# Patient Record
Sex: Male | Born: 1960 | Race: White | Hispanic: No | Marital: Married | Smoking: Current every day smoker
Health system: Southern US, Community
[De-identification: ages and names within clinical notes are randomized; demographics above are authoritative.]

## PROBLEM LIST (undated history)

## (undated) DIAGNOSIS — I10 Essential (primary) hypertension: Secondary | ICD-10-CM

## (undated) HISTORY — PX: APPENDECTOMY: SHX54

## (undated) HISTORY — DX: Essential (primary) hypertension: I10

---

## 1998-06-23 ENCOUNTER — Encounter: Payer: Self-pay | Admitting: General Surgery

## 1998-06-23 ENCOUNTER — Inpatient Hospital Stay (HOSPITAL_COMMUNITY): Admission: EM | Admit: 1998-06-23 | Discharge: 1998-07-01 | Payer: Self-pay | Admitting: Emergency Medicine

## 2019-10-14 ENCOUNTER — Other Ambulatory Visit: Payer: Self-pay

## 2019-10-14 ENCOUNTER — Encounter: Payer: Self-pay | Admitting: Physician Assistant

## 2019-10-14 ENCOUNTER — Ambulatory Visit (INDEPENDENT_AMBULATORY_CARE_PROVIDER_SITE_OTHER): Payer: Self-pay | Admitting: Physician Assistant

## 2019-10-14 DIAGNOSIS — Z8249 Family history of ischemic heart disease and other diseases of the circulatory system: Secondary | ICD-10-CM

## 2019-10-14 DIAGNOSIS — Z720 Tobacco use: Secondary | ICD-10-CM

## 2019-10-14 DIAGNOSIS — I10 Essential (primary) hypertension: Secondary | ICD-10-CM | POA: Insufficient documentation

## 2019-10-14 DIAGNOSIS — N529 Male erectile dysfunction, unspecified: Secondary | ICD-10-CM | POA: Insufficient documentation

## 2019-10-14 LAB — BASIC METABOLIC PANEL
BUN: 6 mg/dL (ref 6–23)
CO2: 28 mEq/L (ref 19–32)
Calcium: 9.8 mg/dL (ref 8.4–10.5)
Chloride: 98 mEq/L (ref 96–112)
Creatinine, Ser: 0.9 mg/dL (ref 0.40–1.50)
GFR: 86.35 mL/min (ref >60.00)
Glucose, Bld: 100 mg/dL — ABNORMAL HIGH (ref 70–99)
Potassium: 4 mEq/L (ref 3.5–5.1)
Sodium: 136 mEq/L (ref 135–145)

## 2019-10-14 MED ORDER — AMLODIPINE BESYLATE 10 MG PO TABS: 10.0000 mg | ORAL_TABLET | Freq: Every day | ORAL | 1 refills | Status: DC

## 2019-10-14 MED ORDER — SILDENAFIL CITRATE 20 MG PO TABS: ORAL_TABLET | ORAL | 0 refills | Status: DC

## 2019-10-14 MED ORDER — LISINOPRIL 10 MG PO TABS: 10.0000 mg | ORAL_TABLET | Freq: Every day | ORAL | 1 refills | Status: DC

## 2019-10-14 NOTE — Patient Instructions (Addendum)
Please go to the lab today for blood work.  I will call you with your results. We will alter treatment regimen(s) if indicated by your results.    I have sent medication refills to the pharmacy. I have also sent in a few tablets of generic viagra to use as directed for ED.  Keep well-hydrated and follow a low-salt diet.   We will follow-up every 6 months for your elevated BP. It is very important that we work on preventive measures -- colon cancer screening, lab work, Social research officer, government. Also stress testing giving your family history. I know you are self pay right now.  Please speak to the ladies at the front desk about information regarding the [facility] discount program so we can see if you qualify. This way we can hopefully get more things done for you in a very cost effective way for you. You may also want to look into our [employer] and [employer].    DASH Eating Plan DASH stands for "Dietary Approaches to Stop Hypertension." The DASH eating plan is a healthy eating plan that has been shown to reduce high blood pressure (hypertension). It may also reduce your risk for type 2 diabetes, heart disease, and stroke. The DASH eating plan may also help with weight loss. What are tips for following this plan?  General guidelines  Avoid eating more than 2,300 mg (milligrams) of salt (sodium) a day. If you have hypertension, you may need to reduce your sodium intake to 1,500 mg a day.  Limit alcohol intake to no more than 1 drink a day for nonpregnant women and 2 drinks a day for men. One drink equals 12 oz of beer, 5 oz of wine, or 1 oz of hard liquor.  Work with your health care provider to maintain a healthy body weight or to lose weight. Ask what an ideal weight is for you.  Get at least 30 minutes of exercise that causes your heart to beat faster (aerobic exercise) most days of the week. Activities may include walking, swimming, or biking.  Work with your health care provider or diet and  nutrition specialist (dietitian) to adjust your eating plan to your individual calorie needs. Reading food labels   Check food labels for the amount of sodium per serving. Choose foods with less than 5 percent of the Daily Value of sodium. Generally, foods with less than 300 mg of sodium per serving fit into this eating plan.  To find whole grains, look for the word "whole" as the first word in the ingredient list. Shopping  Buy products labeled as "low-sodium" or "no salt added."  Buy fresh foods. Avoid canned foods and premade or frozen meals. Cooking  Avoid adding salt when cooking. Use salt-free seasonings or herbs instead of table salt or sea salt. Check with your health care provider or pharmacist before using salt substitutes.  Do not fry foods. Cook foods using healthy methods such as baking, boiling, grilling, and broiling instead.  Cook with heart-healthy oils, such as olive, canola, soybean, or sunflower oil. Meal planning  Eat a balanced diet that includes: ? 5 or more servings of fruits and vegetables each day. At each meal, try to fill half of your plate with fruits and vegetables. ? Up to 6-8 servings of whole grains each day. ? Less than 6 oz of lean meat, poultry, or fish each day. A 3-oz serving of meat is about the same size as a deck of cards. One egg equals 1  oz. ? 2 servings of low-fat dairy each day. ? A serving of nuts, seeds, or beans 5 times each week. ? Heart-healthy fats. Healthy fats called Omega-3 fatty acids are found in foods such as flaxseeds and coldwater fish, like sardines, salmon, and mackerel.  Limit how much you eat of the following: ? Canned or prepackaged foods. ? Food that is high in trans fat, such as fried foods. ? Food that is high in saturated fat, such as fatty meat. ? Sweets, desserts, sugary drinks, and other foods with added sugar. ? Full-fat dairy products.  Do not salt foods before eating.  Try to eat at least 2 vegetarian  meals each week.  Eat more home-cooked food and less restaurant, buffet, and fast food.  When eating at a restaurant, ask that your food be prepared with less salt or no salt, if possible. What foods are recommended? The items listed may not be a complete list. Talk with your dietitian about what dietary choices are best for you. Grains Whole-grain or whole-wheat bread. Whole-grain or whole-wheat pasta. Brown rice. [place]. Bulgur. Whole-grain and low-sodium cereals. Pita bread. Low-fat, low-sodium crackers. Whole-wheat flour tortillas. Vegetables Fresh or frozen vegetables (raw, steamed, roasted, or grilled). Low-sodium or reduced-sodium tomato and vegetable juice. Low-sodium or reduced-sodium tomato sauce and tomato paste. Low-sodium or reduced-sodium canned vegetables. Fruits All fresh, dried, or frozen fruit. Canned fruit in natural juice (without added sugar). Meat and other protein foods Skinless chicken or [place]. Ground chicken or [place]. Pork with fat trimmed off. Fish and seafood. Egg whites. Dried beans, peas, or lentils. Unsalted nuts, nut butters, and seeds. Unsalted canned beans. Lean cuts of beef with fat trimmed off. Low-sodium, lean deli meat. Dairy Low-fat (1%) or fat-free (skim) milk. Fat-free, low-fat, or reduced-fat cheeses. Nonfat, low-sodium ricotta or cottage cheese. Low-fat or nonfat yogurt. Low-fat, low-sodium cheese. Fats and oils Soft margarine without trans fats. Vegetable oil. Low-fat, reduced-fat, or light mayonnaise and salad dressings (reduced-sodium). Canola, safflower, olive, soybean, and sunflower oils. Avocado. Seasoning and other foods Herbs. Spices. Seasoning mixes without salt. Unsalted popcorn and pretzels. Fat-free sweets. What foods are not recommended? The items listed may not be a complete list. Talk with your dietitian about what dietary choices are best for you. Grains Baked goods made with fat, such as croissants, muffins, or some  breads. Dry pasta or rice meal packs. Vegetables Creamed or fried vegetables. Vegetables in a cheese sauce. Regular canned vegetables (not low-sodium or reduced-sodium). Regular canned tomato sauce and paste (not low-sodium or reduced-sodium). Regular tomato and vegetable juice (not low-sodium or reduced-sodium). Angie Fava. Olives. Fruits Canned fruit in a light or heavy syrup. Fried fruit. Fruit in cream or butter sauce. Meat and other protein foods Fatty cuts of meat. Ribs. Fried meat. Berniece Salines. Sausage. Bologna and other processed lunch meats. Salami. Fatback. Hotdogs. Bratwurst. Salted nuts and seeds. Canned beans with added salt. Canned or smoked fish. Whole eggs or egg yolks. Chicken or [place] with skin. Dairy Whole or 2% milk, cream, and half-and-half. Whole or full-fat cream cheese. Whole-fat or sweetened yogurt. Full-fat cheese. Nondairy creamers. Whipped toppings. Processed cheese and cheese spreads. Fats and oils Butter. Stick margarine. Lard. Shortening. Ghee. Bacon fat. Tropical oils, such as coconut, palm kernel, or palm oil. Seasoning and other foods Salted popcorn and pretzels. Onion salt, garlic salt, seasoned salt, table salt, and sea salt. Worcestershire sauce. Tartar sauce. Barbecue sauce. Teriyaki sauce. Soy sauce, including reduced-sodium. Steak sauce. Canned and packaged gravies. Fish sauce. Oyster sauce.  Cocktail sauce. Horseradish that you find on the shelf. Ketchup. Mustard. Meat flavorings and tenderizers. Bouillon cubes. Hot sauce and Tabasco sauce. Premade or packaged marinades. Premade or packaged taco seasonings. Relishes. Regular salad dressings. Where to find more information:  National Heart, Lung, and Blood Institute: PopSteam.is  American Heart Association: www.heart.org Summary  The DASH eating plan is a healthy eating plan that has been shown to reduce high blood pressure (hypertension). It may also reduce your risk for type 2 diabetes, heart disease, and  stroke.  With the DASH eating plan, you should limit salt (sodium) intake to 2,300 mg a day. If you have hypertension, you may need to reduce your sodium intake to 1,500 mg a day.  When on the DASH eating plan, aim to eat more fresh fruits and vegetables, whole grains, lean proteins, low-fat dairy, and heart-healthy fats.  Work with your health care provider or diet and nutrition specialist (dietitian) to adjust your eating plan to your individual calorie needs. This information is not intended to replace advice given to you by your health care provider. Make sure you discuss any questions you have with your health care provider. Document Revised: 03/22/2017 Document Reviewed: 04/02/2016 Elsevier Patient Education  2020 [employer].

## 2019-10-14 NOTE — Progress Notes (Signed)
Patient presents to clinic today to establish care.  Patient with a past medical history of hypertension, diagnosed in 2019. Denies history of elevated cholesterol. Denies history of stroke or heart attack. Denies prior stress testing. Is a current smoker, smoking 4 ppd x 45 years. No interest in quitting. Is currently on a regimen of amlodipine and lisinopril 10 mg daily each. Endorses taking as directed and tolerating well. Notes BP has been well-controlled on this regimen. Patient denies chest pain, palpitations, lightheadedness, dizziness, vision changes or frequent headaches. Is in need of medication refills. Does not remember last time he had CPE or lab work done as he has been without insurance.   Patient also notes several year history of difficulty maintaining erection sufficient enough for penetration. Would like to discuss medication.   Health Maintenance: Colonoscopy -- Never had. Average risk. Asymptomatic. Is without insurance.   Past Medical History:  Diagnosis Date  . Hypertension     Past Surgical History:  Procedure Laterality Date  . APPENDECTOMY     30s    No current outpatient medications on file prior to visit.   No current facility-administered medications on file prior to visit.    No Known Allergies  Family History  Problem Relation Age of Onset  . Arthritis Mother   . Hearing loss Mother   . Heart disease Mother   . Hyperlipidemia Mother   . Hypertension Mother   . Kidney disease Mother   . Miscarriages / [place] Mother   . Stroke Mother   . Heart attack Father   . Heart disease Father   . Hypertension Father   . Stroke Father   . Asthma Sister   . Early death Sister   . Heart disease Sister   . Hypertension Sister   . Alcohol abuse Brother   . Asthma Brother   . Depression Brother   . Drug abuse Brother   . Heart attack Brother   . Hypertension Brother   . Mental illness Brother   . Stroke Brother   . Early death Son   . Cancer  Sister        Breast Cancer  . Heart attack Sister   . Heart disease Sister   . Hypertension Sister     Social History   Socioeconomic History  . Marital status: Married    Spouse name: Not on file  . Number of children: Not on file  . Years of education: Not on file  . Highest education level: Not on file  Occupational History  . Not on file  Tobacco Use  . Smoking status: Current Every Day Smoker    Types: Cigarettes, Cigars  . Smokeless tobacco: Never Used  Vaping Use  . Vaping Use: Every day  Substance and Sexual Activity  . Alcohol use: Yes    Alcohol/week: 15.0 standard drinks    Types: 15 Shots of liquor per week  . Drug use: Never  . Sexual activity: Yes    Birth control/protection: None  Other Topics Concern  . Not on file  Social History Narrative  . Not on file   Social Determinants of Health   Financial Resource Strain:   . Difficulty of Paying Living Expenses:   Food Insecurity:   . Worried About Charity fundraiser in the Last Year:   . Arboriculturist in the Last Year:   Transportation Needs:   . Film/video editor (Medical):   Marland Kitchen Lack of Transportation (Non-Medical):  Physical Activity:   . Days of Exercise per Week:   . Minutes of Exercise per Session:   Stress:   . Feeling of Stress :   Social Connections:   . Frequency of Communication with Friends and Family:   . Frequency of Social Gatherings with Friends and Family:   . Attends Religious Services:   . Active Member of Clubs or Organizations:   . Attends Banker Meetings:   Marland Kitchen Marital Status:   Intimate Partner Violence:   . Fear of Current or Ex-Partner:   . Emotionally Abused:   Marland Kitchen Physically Abused:   . Sexually Abused:    ROS Pertinent ROS are listed in the HPI.  BP 130/72   Pulse 81   Temp 97.9 F (36.6 C) (Temporal)   Resp 18   Ht 5' 10.5" (1.791 m)   Wt 143 lb 3.2 oz (65 kg)   SpO2 99%   BMI 20.26 kg/m   Physical Exam Vitals reviewed.    Constitutional:      Appearance: Normal appearance.  HENT:     Head: Normocephalic and atraumatic.  Eyes:     Extraocular Movements: Extraocular movements intact.     Conjunctiva/sclera: Conjunctivae normal.     Pupils: Pupils are equal, round, and reactive to light.  Cardiovascular:     Rate and Rhythm: Normal rate and regular rhythm.     Heart sounds: Normal heart sounds.  Pulmonary:     Effort: Pulmonary effort is normal.     Breath sounds: Normal breath sounds.  Musculoskeletal:     Cervical back: Neck supple.  Neurological:     General: No focal deficit present.     Mental Status: He is alert and oriented to person, place, and time.  Psychiatric:        Mood and Affect: Mood normal.    Assessment/Plan: 1. Essential hypertension BP stable today. Asymptomatic. Will check BMP to assess renal function and potassium with being on the lisinopril. Medications refilled. Discussed the need for preventive care as his cholesterol and glucose especially need to be checked giving hypertension, tobacco use and family history of early CAD. EKG and stress testing should also be considered. Patient without insurance and declines further intervention today. He has been encouraged to fill out application for the [facility] discount program so that screenings can be cost effective. HE is also to look into new managed medicaid - amLODipine (NORVASC) 10 MG tablet; Take 1 tablet (10 mg total) by mouth daily.  Dispense: 90 tablet; Refill: 1 - lisinopril (ZESTRIL) 10 MG tablet; Take 1 tablet (10 mg total) by mouth daily.  Dispense: 90 tablet; Refill: 1 - Basic metabolic panel  2. Erectile dysfunction, unspecified erectile dysfunction type Likely combination of age, hypertension and chronic tobacco use. Likely elevated cholesterol as well but unable to assess for this thus far. Will start trial of generic sildenafil 20 mg tablets to help with symptoms.   3. Family history of early CAD Significant family  history. Again recommend he apply for [facility] discount or managed medicaid so we can obtain preventive screenings for prevention. Recommend 81 mg ASA daily for him.   4. Tobacco abuse disorder 90 pack-year smoking history. Declines cessation AMA. Unable to afford lung cancer screens so will have to defer for now. Lungs CTAB today. No wheezing noted with patient ambulating. O2 sats look good. Will monitor closely.   This visit occurred during the SARS-CoV-2 public health emergency.  Safety protocols were in place,  including screening questions prior to the visit, additional usage of staff PPE, and extensive cleaning of exam room while observing appropriate contact time as indicated for disinfecting solutions.     Leeanne Rio, PA-C

## 2019-11-09 ENCOUNTER — Other Ambulatory Visit: Payer: Self-pay | Admitting: Physician Assistant

## 2020-10-17 ENCOUNTER — Other Ambulatory Visit: Payer: Self-pay

## 2020-10-17 DIAGNOSIS — I10 Essential (primary) hypertension: Secondary | ICD-10-CM

## 2020-10-17 MED ORDER — AMLODIPINE BESYLATE 10 MG PO TABS: 10.0000 mg | ORAL_TABLET | Freq: Every day | ORAL | 0 refills | Status: DC

## 2020-10-17 MED ORDER — LISINOPRIL 10 MG PO TABS: 10.0000 mg | ORAL_TABLET | Freq: Every day | ORAL | 0 refills | Status: DC

## 2021-03-24 ENCOUNTER — Ambulatory Visit (INDEPENDENT_AMBULATORY_CARE_PROVIDER_SITE_OTHER): Payer: Self-pay | Admitting: Family Medicine

## 2021-03-24 ENCOUNTER — Encounter: Payer: Self-pay | Admitting: Family Medicine

## 2021-03-24 VITALS — BP 128/80 | HR 101 | Temp 98.2°F | Resp 16 | Ht 70.5 in | Wt 156.6 lb

## 2021-03-24 DIAGNOSIS — Z13220 Encounter for screening for lipoid disorders: Secondary | ICD-10-CM

## 2021-03-24 DIAGNOSIS — Z720 Tobacco use: Secondary | ICD-10-CM

## 2021-03-24 DIAGNOSIS — Z8249 Family history of ischemic heart disease and other diseases of the circulatory system: Secondary | ICD-10-CM

## 2021-03-24 DIAGNOSIS — I10 Essential (primary) hypertension: Secondary | ICD-10-CM

## 2021-03-24 LAB — COMPREHENSIVE METABOLIC PANEL
ALT: 216 U/L — ABNORMAL HIGH (ref 0–53)
AST: 205 U/L — ABNORMAL HIGH (ref 0–37)
Albumin: 4.3 g/dL (ref 3.5–5.2)
Alkaline Phosphatase: 94 U/L (ref 39–117)
BUN: 9 mg/dL (ref 6–23)
CO2: 26 mEq/L (ref 19–32)
Calcium: 9.6 mg/dL (ref 8.4–10.5)
Chloride: 102 mEq/L (ref 96–112)
Creatinine, Ser: 0.98 mg/dL (ref 0.40–1.50)
GFR: 83.81 mL/min (ref >60.00)
Glucose, Bld: 61 mg/dL — ABNORMAL LOW (ref 70–99)
Potassium: 3.8 mEq/L (ref 3.5–5.1)
Sodium: 138 mEq/L (ref 135–145)
Total Bilirubin: 0.5 mg/dL (ref 0.2–1.2)
Total Protein: 8.9 g/dL — ABNORMAL HIGH (ref 6.0–8.3)

## 2021-03-24 LAB — LIPID PANEL
Cholesterol: 207 mg/dL — ABNORMAL HIGH (ref 0–200)
HDL: 59.1 mg/dL (ref >39.00)
LDL Cholesterol: 110 mg/dL — ABNORMAL HIGH (ref 0–99)
NonHDL: 147.85
Total CHOL/HDL Ratio: 4
Triglycerides: 190 mg/dL — ABNORMAL HIGH (ref 0.0–149.0)
VLDL: 38 mg/dL (ref 0.0–40.0)

## 2021-03-24 MED ORDER — LISINOPRIL 10 MG PO TABS: 10.0000 mg | ORAL_TABLET | Freq: Every day | ORAL | 1 refills | Status: DC

## 2021-03-24 MED ORDER — AMLODIPINE BESYLATE 5 MG PO TABS: 5.0000 mg | ORAL_TABLET | Freq: Every day | ORAL | 2 refills | Status: DC

## 2021-03-24 NOTE — Patient Instructions (Addendum)
Keep up the good work with cutting back on smoking. Quitting can be one of the best investments in your health. Let me know if you need assistance.   I do recommend meeting with cardiology about your chest pain with exertion. Let me know if I can place that referral. I would consider a baby aspirin  once per day. Return to the clinic or go to the nearest emergency room if any of your symptoms worsen or new symptoms occur.   If cholesterol is elevated I would definitely recommend a statin medicine based on your family history.   We will try lower dose blood pressure med to see if that lessens dizziness. Keep a record of your blood pressures outside of the office and if running low on meds, or over 140/90 - let me know.   Thanks for coming in today.   Managing Your Hypertension Hypertension, also called high blood pressure, is when the force of the blood pressing against the walls of the arteries is too strong. Arteries are blood vessels that carry blood from your heart throughout your body. Hypertension forces the heart to work harder to pump blood and may cause the arteries to become narrow or stiff. Understanding blood pressure readings Your personal target blood pressure may vary depending on your medical conditions, your age, and other factors. A blood pressure reading includes a higher number over a lower number. Ideally, your blood pressure should be below 120/80. You should know that: The first, or top, number is called the systolic pressure. It is a measure of the pressure in your arteries as your heart beats. The second, or bottom number, is called the diastolic pressure. It is a measure of the pressure in your arteries as the heart relaxes. Blood pressure is classified into four stages. Based on your blood pressure reading, your health care provider may use the following stages to determine what type of treatment you need, if any. Systolic pressure and diastolic pressure are measured in  a unit called mmHg. Normal Systolic pressure: below 120. Diastolic pressure: below 80. Elevated Systolic pressure: 120-129. Diastolic pressure: below 80. Hypertension stage 1 Systolic pressure: 130-139. Diastolic pressure: 80-89. Hypertension stage 2 Systolic pressure: 140 or above. Diastolic pressure: 90 or above. How can this condition affect me? Managing your hypertension is an important responsibility. Over time, hypertension can damage the arteries and decrease blood flow to important parts of the body, including the brain, heart, and kidneys. Having untreated or uncontrolled hypertension can lead to: A heart attack. A stroke. A weakened blood vessel (aneurysm). Heart failure. Kidney damage. Eye damage. Metabolic syndrome. Memory and concentration problems. Vascular dementia. What actions can I take to manage this condition? Hypertension can be managed by making lifestyle changes and possibly by taking medicines. Your health care provider will help you make a plan to bring your blood pressure within a normal range. Nutrition  Eat a diet that is high in fiber and potassium, and low in salt (sodium), added sugar, and fat. An example eating plan is called the Dietary Approaches to Stop Hypertension (DASH) diet. To eat this way: Eat plenty of fresh fruits and vegetables. Try to fill one-half of your plate at each meal with fruits and vegetables. Eat whole grains, such as whole-wheat pasta, brown rice, or whole-grain bread. Fill about one-fourth of your plate with whole grains. Eat low-fat dairy products. Avoid fatty cuts of meat, processed or cured meats, and poultry with skin. Fill about one-fourth of your plate with lean  proteins such as fish, chicken without skin, beans, eggs, and tofu. Avoid pre-made and processed foods. These tend to be higher in sodium, added sugar, and fat. Reduce your daily sodium intake. Most people with hypertension should eat less than 1,500 mg of sodium  a day. Lifestyle  Work with your health care provider to maintain a healthy body weight or to lose weight. Ask what an ideal weight is for you. Get at least 30 minutes of exercise that causes your heart to beat faster (aerobic exercise) most days of the week. Activities may include walking, swimming, or biking. Include exercise to strengthen your muscles (resistance exercise), such as weight lifting, as part of your weekly exercise routine. Try to do these types of exercises for 30 minutes at least 3 days a week. Do not use any products that contain nicotine or tobacco, such as cigarettes, e-cigarettes, and chewing tobacco. If you need help quitting, ask your health care provider. Control any long-term (chronic) conditions you have, such as high cholesterol or diabetes. Identify your sources of stress and find ways to manage stress. This may include meditation, deep breathing, or making time for fun activities. Alcohol use Do not drink alcohol if: Your health care provider tells you not to drink. You are pregnant, may be pregnant, or are planning to become pregnant. If you drink alcohol: Limit how much you use to: 0-1 drink a day for women. 0-2 drinks a day for men. Be aware of how much alcohol is in your drink. In the [place] one drink equals one 12 oz bottle of beer (355 mL), one 5 oz glass of wine (148 mL), or one 1 oz glass of hard liquor (44 mL). Medicines Your health care provider may prescribe medicine if lifestyle changes are not enough to get your blood pressure under control and if: Your systolic blood pressure is 130 or higher. Your diastolic blood pressure is 80 or higher. Take medicines only as told by your health care provider. Follow the directions carefully. Blood pressure medicines must be taken as told by your health care provider. The medicine does not work as well when you skip doses. Skipping doses also puts you at risk for problems. Monitoring Before you monitor your  blood pressure: Do not smoke, drink caffeinated beverages, or exercise within 30 minutes before taking a measurement. Use the bathroom and empty your bladder (urinate). Sit quietly for at least 5 minutes before taking measurements. Monitor your blood pressure at home as told by your health care provider. To do this: Sit with your back straight and supported. Place your feet flat on the floor. Do not cross your legs. Support your arm on a flat surface, such as a table. Make sure your upper arm is at heart level. Each time you measure, take two or three readings one minute apart and record the results. You may also need to have your blood pressure checked regularly by your health care provider. General information Talk with your health care provider about your diet, exercise habits, and other lifestyle factors that may be contributing to hypertension. Review all the medicines you take with your health care provider because there may be side effects or interactions. Keep all visits as told by your health care provider. Your health care provider can help you create and adjust your plan for managing your high blood pressure. Where to find more information National Heart, Lung, and Blood Institute: [employer] American Heart Association: www.heart.org Contact a health care provider if: You think you  are having a reaction to medicines you have taken. You have repeated (recurrent) headaches. You feel dizzy. You have swelling in your ankles. You have trouble with your vision. Get help right away if: You develop a severe headache or confusion. You have unusual weakness or numbness, or you feel faint. You have severe pain in your chest or abdomen. You vomit repeatedly. You have trouble breathing. These symptoms may represent a serious problem that is an emergency. Do not wait to see if the symptoms will go away. Get medical help right away. Call your local emergency services (911 in the  [place] Do not drive yourself to the hospital. Summary Hypertension is when the force of blood pumping through your arteries is too strong. If this condition is not controlled, it may put you at risk for serious complications. Your personal target blood pressure may vary depending on your medical conditions, your age, and other factors. For most people, a normal blood pressure is less than 120/80. Hypertension is managed by lifestyle changes, medicines, or both. Lifestyle changes to help manage hypertension include losing weight, eating a healthy, low-sodium diet, exercising more, stopping smoking, and limiting alcohol. This information is not intended to replace advice given to you by your health care provider. Make sure you discuss any questions you have with your health care provider. Document Revised: 04/27/2019 Document Reviewed: 03/10/2019 Elsevier Patient Education  2022 [employer]    Steps to Quit Smoking Smoking tobacco is the leading cause of preventable death. It can affect almost every organ in the body. Smoking puts you and those around you at risk for developing many serious chronic diseases. Quitting smoking can be difficult, but it is one of the best things that you can do for your health. It is never too late to quit. How do I get ready to quit? When you decide to quit smoking, create a plan to help you succeed. Before you quit: Pick a date to quit. Set a date within the next 2 weeks to give you time to prepare. Write down the reasons why you are quitting. Keep this list in places where you will see it often. Tell your family, friends, and co-workers that you are quitting. Support from your loved ones can make quitting easier. Talk with your health care provider about your options for quitting smoking. Find out what treatment options are covered by your health insurance. Identify people, places, things, and activities that make you want to smoke (triggers). Avoid them. What  first steps can I take to quit smoking? Throw away all cigarettes at home, at work, and in your car. Throw away smoking accessories, such as Set designer. Clean your car. Make sure to empty the ashtray. Clean your home, including curtains and carpets. What strategies can I use to quit smoking? Talk with your health care provider about combining strategies, such as taking medicines while you are also receiving in-person counseling. Using these two strategies together makes you more likely to succeed in quitting than if you used either strategy on its own. If you are pregnant or breastfeeding, talk with your health care provider about finding counseling or other support strategies to quit smoking. Do not take medicine to help you quit smoking unless your health care provider tells you to do so. To quit smoking: Quit right away Quit smoking completely, instead of gradually reducing how much you smoke over a period of time. Research shows that stopping smoking right away is more successful than gradually quitting. Attend  in-person counseling to help you build problem-solving skills. You are more likely to succeed in quitting if you attend counseling sessions regularly. Even short sessions of 10 minutes can be effective. Take medicine You may take medicines to help you quit smoking. Some medicines require a prescription and some you can purchase over-the-counter. Medicines may have nicotine in them to replace the nicotine in cigarettes. Medicines may: Help to stop cravings. Help to relieve withdrawal symptoms. Your health care provider may recommend: Nicotine patches, gum, or lozenges. Nicotine inhalers or sprays. Non-nicotine medicine that is taken by mouth. Find resources Find resources and support systems that can help you to quit smoking and remain smoke-free after you quit. These resources are most helpful when you use them often. They include: Online chats with a Veterinary surgeon. Telephone  quitlines. Printed Materials engineer. Support groups or group counseling. Text messaging programs. Mobile phone apps or applications. Use apps that can help you stick to your quit plan by providing reminders, tips, and encouragement. There are many free apps for mobile devices as well as websites. Examples include Quit Guide from the [employer] and smokefree.gov What things can I do to make it easier to quit?  Reach out to your family and friends for support and encouragement. Call telephone quitlines (1-800-QUIT-NOW), reach out to support groups, or work with a counselor for support. Ask people who smoke to avoid smoking around you. Avoid places that trigger you to smoke, such as bars, parties, or smoke-break areas at work. Spend time with people who do not smoke. Lessen the stress in your life. Stress can be a smoking trigger for some people. To lessen stress, try: Exercising regularly. Doing deep-breathing exercises. Doing yoga. Meditating. Performing a body scan. This involves closing your eyes, scanning your body from head to toe, and noticing which parts of your body are particularly tense. Try to relax the muscles in those areas. How will I feel when I quit smoking? Day 1 to 3 weeks Within the first 24 hours of quitting smoking, you may start to feel withdrawal symptoms. These symptoms are usually most noticeable 2-3 days after quitting, but they usually do not last for more than 2-3 weeks. You may experience these symptoms: Mood swings. Restlessness, anxiety, or irritability. Trouble concentrating. Dizziness. Strong cravings for sugary foods and nicotine. Mild weight gain. Constipation. Nausea. Coughing or a sore throat. Changes in how the medicines that you take for unrelated issues work in your body. Depression. Trouble sleeping (insomnia). Week 3 and afterward After the first 2-3 weeks of quitting, you may start to notice more positive results, such as: Improved sense of smell  and taste. Decreased coughing and sore throat. Slower heart rate. Lower blood pressure. Clearer skin. The ability to breathe more easily. Fewer sick days. Quitting smoking can be very challenging. Do not get discouraged if you are not successful the first time. Some people need to make many attempts to quit before they achieve long-term success. Do your best to stick to your quit plan, and talk with your health care provider if you have any questions or concerns. Summary Smoking tobacco is the leading cause of preventable death. Quitting smoking is one of the best things that you can do for your health. When you decide to quit smoking, create a plan to help you succeed. Quit smoking right away, not slowly over a period of time. When you start quitting, seek help from your health care provider, family, or friends. This information is not intended to replace advice  given to you by your health care provider. Make sure you discuss any questions you have with your health care provider. Document Revised: 12/16/2020 Document Reviewed: 06/28/2018 Elsevier Patient Education  2022 [employer].

## 2021-03-24 NOTE — Progress Notes (Signed)
Subjective:  Patient ID: Evan Bryant, male    DOB: 12/17/1960  Age: 60 y.o. MRN: [id number]  CC:  Chief Complaint  Patient presents with   Establish Care    Pt here to establish care, pt reports he is in need of a refill on his blood pressure medications today no concerns as they have worked well for him     HPI Evan Bryant presents for  New patient to establish care.  Previous primary care provider Malva Cogan, PA-C.  Hypertension: Treated with lisinopril 10 mg daily, amlodipine 10 mg daily.  Last labs normal in June 2021.  Glucose borderline at 100.off meds for 2 days, no side effects on meds.  Home readings: 118 systolic, 70-75.  Gets dizzy at times working in yard, or bending down/standing up.  FH CAD - sister deceased with heart disease, other sister with ICD/pacemeker, brother with MI and CVA, father had CVA and CABGx4. No personal hx of MI/CVA. Declines referral to cardiology at this time.  Chest pain with exertion - using tiller in garden, raking leaves, same for 10 years, no recent changes. Rests and has cigarette and feels better. No dyspnea.  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   BP Readings from Last 3 Encounters:  03/24/21 128/80  10/14/19 130/72   Lab Results  Component Value Date   CREATININE 0.90 10/14/2019   Nicotine addiction Cigarettes, 4 packs/day x 45 years, now cut back to 1 pack per day. Easier with GF quitting. Plans to eventually quit smoking  Erectile dysfunction Discussed last year.  Difficulty maintaining erection.  Sildenafil 20 mg prescribed - thinks was ental issue - did not need. Doing ok now without meds.   Health maintenance: Flu vaccine: declines COVID-vaccine: declines.  Colonoscopy/colon cancer screening: deferred.  History Patient Active Problem List   Diagnosis Date Noted   Hypertension 10/14/2019   Erectile dysfunction 10/14/2019   Family history of early CAD 10/14/2019   Tobacco abuse disorder 10/14/2019    Past Medical History:  Diagnosis Date   Hypertension    Past Surgical History:  Procedure Laterality Date   APPENDECTOMY     30s   No Known Allergies Prior to Admission medications   Medication Sig Start Date End Date Taking? Authorizing Provider  amLODipine (NORVASC) 10 MG tablet Take 1 tablet (10 mg total) by mouth daily. 10/17/20  Yes Worthy Rancher B, FNP  lisinopril (ZESTRIL) 10 MG tablet Take 1 tablet (10 mg total) by mouth daily. 10/17/20  Yes Worthy Rancher B, FNP  sildenafil (REVATIO) 20 MG tablet TAKE 1-2 TABLET BY MOUTH AS DIRECTED FOR ED. NO MORE THAN 1 DOSE IN 24 HOUR PERIOD. Patient not taking: Reported on 03/24/2021 11/09/19   Waldon Merl, PA-C   Social History   Socioeconomic History   Marital status: Married    Spouse name: Not on file   Number of children: Not on file   Years of education: Not on file   Highest education level: Not on file  Occupational History   Not on file  Tobacco Use   Smoking status: Every Day    Types: Cigarettes, Cigars   Smokeless tobacco: Never  Vaping Use   Vaping Use: Every day  Substance and Sexual Activity   Alcohol use: Yes    Alcohol/week: 15.0 standard drinks    Types: 15 Shots of liquor per week   Drug use: Never   Sexual activity: Yes    Birth control/protection: None  Other Topics Concern   Not on file  Social History Narrative   Not on file   Social Determinants of Health   Financial Resource Strain: Not on file  Food Insecurity: Not on file  Transportation Needs: Not on file  Physical Activity: Not on file  Stress: Not on file  Social Connections: Not on file  Intimate Partner Violence: Not on file    Review of Systems  Per HPI.  Objective:   Vitals:   03/24/21 0803  BP: 128/80  Pulse: (!) 101  Resp: 16  Temp: 98.2 F (36.8 C)  TempSrc: Temporal  SpO2: 99%  Weight: 156 lb 9.6 oz (71 kg)  Height: 5' 10.5" (1.791 m)     Physical Exam Vitals reviewed.  Constitutional:       Appearance: He is well-developed.  HENT:     Head: Normocephalic and atraumatic.  Neck:     Vascular: No carotid bruit or JVD.  Cardiovascular:     Rate and Rhythm: Normal rate and regular rhythm.     Heart sounds: Normal heart sounds. No murmur heard. Pulmonary:     Effort: Pulmonary effort is normal.     Breath sounds: Normal breath sounds. No rales.  Musculoskeletal:     Right lower leg: No edema.     Left lower leg: No edema.  Skin:    General: Skin is warm and dry.  Neurological:     Mental Status: He is alert and oriented to person, place, and time.  Psychiatric:        Mood and Affect: Mood normal.       Assessment & Plan:  Evan Bryant is a 60 y.o. male . Essential hypertension - Plan: amLODipine (NORVASC) 5 MG tablet, lisinopril (ZESTRIL) 10 MG tablet, Comprehensive metabolic panel  -Controlled, including off past 2 days.  Question lower readings on current regimen with episodic lightheadedness/dizziness.  Decrease amlodipine to 5 mg with close home monitoring.  Call if elevated or low readings.  Family history of early CAD - Plan: Lipid panel  -Discussed concerns with multiple family members with heart disease.  Does report chest pain only with maximal exertion, not with usual activities.  No change in symptoms, reports stability past 10 years.  Importance of smoking cessation discussed as below.  Check lipid panel and would strongly recommend a statin if elevated.  Discussed cardiology eval but deferred at this time.  ER/RTC precautions.  Tobacco abuse disorder  -Commended on cutting back to 1 pack, and plans for continued work towards cessation.  Handout given.  Screening for hyperlipidemia - Plan: Comprehensive metabolic panel, Lipid panel   Meds ordered this encounter  Medications   amLODipine (NORVASC) 5 MG tablet    Sig: Take 1 tablet (5 mg total) by mouth daily.    Dispense:  90 tablet    Refill:  2    No more refills on this Rx. Provider no longer in  office. Patient must find new provider to continue refills.   lisinopril (ZESTRIL) 10 MG tablet    Sig: Take 1 tablet (10 mg total) by mouth daily.    Dispense:  90 tablet    Refill:  1    No more refills on this Rx. Provider no longer in office. Patient must find new provider to continue refills.   Patient Instructions  Keep up the good work with cutting back on smoking. Quitting can be one of the best investments in your health. Let me know if  you need assistance.   I do recommend meeting with cardiology about your chest pain with exertion. Let me know if I can place that referral. I would consider a baby aspirin 81mg  once per day. Return to the clinic or go to the nearest emergency room if any of your symptoms worsen or new symptoms occur.   If cholesterol is elevated I would definitely recommend a statin medicine based on your family history.   We will try lower dose blood pressure med to see if that lessens dizziness. Keep a record of your blood pressures outside of the office and if running low on meds, or over 140/90 - let me know.   Thanks for coming in today.   Managing Your Hypertension Hypertension, also called high blood pressure, is when the force of the blood pressing against the walls of the arteries is too strong. Arteries are blood vessels that carry blood from your heart throughout your body. Hypertension forces the heart to work harder to pump blood and may cause the arteries to become narrow or stiff. Understanding blood pressure readings Your personal target blood pressure may vary depending on your medical conditions, your age, and other factors. A blood pressure reading includes a higher number over a lower number. Ideally, your blood pressure should be below 120/80. You should know that: The first, or top, number is called the systolic pressure. It is a measure of the pressure in your arteries as your heart beats. The second, or bottom number, is called the diastolic  pressure. It is a measure of the pressure in your arteries as the heart relaxes. Blood pressure is classified into four stages. Based on your blood pressure reading, your health care provider may use the following stages to determine what type of treatment you need, if any. Systolic pressure and diastolic pressure are measured in a unit called mmHg. Normal Systolic pressure: below 120. Diastolic pressure: below 80. Elevated Systolic pressure: 120-129. Diastolic pressure: below 80. Hypertension stage 1 Systolic pressure: 130-139. Diastolic pressure: 80-89. Hypertension stage 2 Systolic pressure: 140 or above. Diastolic pressure: 90 or above. How can this condition affect me? Managing your hypertension is an important responsibility. Over time, hypertension can damage the arteries and decrease blood flow to important parts of the body, including the brain, heart, and kidneys. Having untreated or uncontrolled hypertension can lead to: A heart attack. A stroke. A weakened blood vessel (aneurysm). Heart failure. Kidney damage. Eye damage. Metabolic syndrome. Memory and concentration problems. Vascular dementia. What actions can I take to manage this condition? Hypertension can be managed by making lifestyle changes and possibly by taking medicines. Your health care provider will help you make a plan to bring your blood pressure within a normal range. Nutrition  Eat a diet that is high in fiber and potassium, and low in salt (sodium), added sugar, and fat. An example eating plan is called the Dietary Approaches to Stop Hypertension (DASH) diet. To eat this way: Eat plenty of fresh fruits and vegetables. Try to fill one-half of your plate at each meal with fruits and vegetables. Eat whole grains, such as whole-wheat pasta, brown rice, or whole-grain bread. Fill about one-fourth of your plate with whole grains. Eat low-fat dairy products. Avoid fatty cuts of meat, processed or cured meats,  and poultry with skin. Fill about one-fourth of your plate with lean proteins such as fish, chicken without skin, beans, eggs, and tofu. Avoid pre-made and processed foods. These tend to be higher in sodium, added  sugar, and fat. Reduce your daily sodium intake. Most people with hypertension should eat less than 1,500 mg of sodium a day. Lifestyle  Work with your health care provider to maintain a healthy body weight or to lose weight. Ask what an ideal weight is for you. Get at least 30 minutes of exercise that causes your heart to beat faster (aerobic exercise) most days of the week. Activities may include walking, swimming, or biking. Include exercise to strengthen your muscles (resistance exercise), such as weight lifting, as part of your weekly exercise routine. Try to do these types of exercises for 30 minutes at least 3 days a week. Do not use any products that contain nicotine or tobacco, such as cigarettes, e-cigarettes, and chewing tobacco. If you need help quitting, ask your health care provider. Control any long-term (chronic) conditions you have, such as high cholesterol or diabetes. Identify your sources of stress and find ways to manage stress. This may include meditation, deep breathing, or making time for fun activities. Alcohol use Do not drink alcohol if: Your health care provider tells you not to drink. You are pregnant, may be pregnant, or are planning to become pregnant. If you drink alcohol: Limit how much you use to: 0-1 drink a day for women. 0-2 drinks a day for men. Be aware of how much alcohol is in your drink. In the [place] one drink equals one 12 oz bottle of beer (355 mL), one 5 oz glass of wine (148 mL), or one 1 oz glass of hard liquor (44 mL). Medicines Your health care provider may prescribe medicine if lifestyle changes are not enough to get your blood pressure under control and if: Your systolic blood pressure is 130 or higher. Your diastolic blood pressure  is 80 or higher. Take medicines only as told by your health care provider. Follow the directions carefully. Blood pressure medicines must be taken as told by your health care provider. The medicine does not work as well when you skip doses. Skipping doses also puts you at risk for problems. Monitoring Before you monitor your blood pressure: Do not smoke, drink caffeinated beverages, or exercise within 30 minutes before taking a measurement. Use the bathroom and empty your bladder (urinate). Sit quietly for at least 5 minutes before taking measurements. Monitor your blood pressure at home as told by your health care provider. To do this: Sit with your back straight and supported. Place your feet flat on the floor. Do not cross your legs. Support your arm on a flat surface, such as a table. Make sure your upper arm is at heart level. Each time you measure, take two or three readings one minute apart and record the results. You may also need to have your blood pressure checked regularly by your health care provider. General information Talk with your health care provider about your diet, exercise habits, and other lifestyle factors that may be contributing to hypertension. Review all the medicines you take with your health care provider because there may be side effects or interactions. Keep all visits as told by your health care provider. Your health care provider can help you create and adjust your plan for managing your high blood pressure. Where to find more information National Heart, Lung, and Blood Institute: PopSteam.is American Heart Association: www.heart.org Contact a health care provider if: You think you are having a reaction to medicines you have taken. You have repeated (recurrent) headaches. You feel dizzy. You have swelling in your ankles. You have  trouble with your vision. Get help right away if: You develop a severe headache or confusion. You have unusual weakness or  numbness, or you feel faint. You have severe pain in your chest or abdomen. You vomit repeatedly. You have trouble breathing. These symptoms may represent a serious problem that is an emergency. Do not wait to see if the symptoms will go away. Get medical help right away. Call your local emergency services (911 in the [place] Do not drive yourself to the hospital. Summary Hypertension is when the force of blood pumping through your arteries is too strong. If this condition is not controlled, it may put you at risk for serious complications. Your personal target blood pressure may vary depending on your medical conditions, your age, and other factors. For most people, a normal blood pressure is less than 120/80. Hypertension is managed by lifestyle changes, medicines, or both. Lifestyle changes to help manage hypertension include losing weight, eating a healthy, low-sodium diet, exercising more, stopping smoking, and limiting alcohol. This information is not intended to replace advice given to you by your health care provider. Make sure you discuss any questions you have with your health care provider. Document Revised: 04/27/2019 Document Reviewed: 03/10/2019 Elsevier Patient Education  2022 Elsevier Inc.    Steps to Quit Smoking Smoking tobacco is the leading cause of preventable death. It can affect almost every organ in the body. Smoking puts you and those around you at risk for developing many serious chronic diseases. Quitting smoking can be difficult, but it is one of the best things that you can do for your health. It is never too late to quit. How do I get ready to quit? When you decide to quit smoking, create a plan to help you succeed. Before you quit: Pick a date to quit. Set a date within the next 2 weeks to give you time to prepare. Write down the reasons why you are quitting. Keep this list in places where you will see it often. Tell your family, friends, and co-workers that you  are quitting. Support from your loved ones can make quitting easier. Talk with your health care provider about your options for quitting smoking. Find out what treatment options are covered by your health insurance. Identify people, places, things, and activities that make you want to smoke (triggers). Avoid them. What first steps can I take to quit smoking? Throw away all cigarettes at home, at work, and in your car. Throw away smoking accessories, such as Set designer. Clean your car. Make sure to empty the ashtray. Clean your home, including curtains and carpets. What strategies can I use to quit smoking? Talk with your health care provider about combining strategies, such as taking medicines while you are also receiving in-person counseling. Using these two strategies together makes you more likely to succeed in quitting than if you used either strategy on its own. If you are pregnant or breastfeeding, talk with your health care provider about finding counseling or other support strategies to quit smoking. Do not take medicine to help you quit smoking unless your health care provider tells you to do so. To quit smoking: Quit right away Quit smoking completely, instead of gradually reducing how much you smoke over a period of time. Research shows that stopping smoking right away is more successful than gradually quitting. Attend in-person counseling to help you build problem-solving skills. You are more likely to succeed in quitting if you attend counseling sessions regularly. Even short sessions  of 10 minutes can be effective. Take medicine You may take medicines to help you quit smoking. Some medicines require a prescription and some you can purchase over-the-counter. Medicines may have nicotine in them to replace the nicotine in cigarettes. Medicines may: Help to stop cravings. Help to relieve withdrawal symptoms. Your health care provider may recommend: Nicotine patches, gum, or  lozenges. Nicotine inhalers or sprays. Non-nicotine medicine that is taken by mouth. Find resources Find resources and support systems that can help you to quit smoking and remain smoke-free after you quit. These resources are most helpful when you use them often. They include: Online chats with a Veterinary surgeon. Telephone quitlines. Printed Materials engineer. Support groups or group counseling. Text messaging programs. Mobile phone apps or applications. Use apps that can help you stick to your quit plan by providing reminders, tips, and encouragement. There are many free apps for mobile devices as well as websites. Examples include Quit Guide from the [employer] and smokefree.gov What things can I do to make it easier to quit?  Reach out to your family and friends for support and encouragement. Call telephone quitlines (1-800-QUIT-NOW), reach out to support groups, or work with a counselor for support. Ask people who smoke to avoid smoking around you. Avoid places that trigger you to smoke, such as bars, parties, or smoke-break areas at work. Spend time with people who do not smoke. Lessen the stress in your life. Stress can be a smoking trigger for some people. To lessen stress, try: Exercising regularly. Doing deep-breathing exercises. Doing yoga. Meditating. Performing a body scan. This involves closing your eyes, scanning your body from head to toe, and noticing which parts of your body are particularly tense. Try to relax the muscles in those areas. How will I feel when I quit smoking? Day 1 to 3 weeks Within the first 24 hours of quitting smoking, you may start to feel withdrawal symptoms. These symptoms are usually most noticeable 2-3 days after quitting, but they usually do not last for more than 2-3 weeks. You may experience these symptoms: Mood swings. Restlessness, anxiety, or irritability. Trouble concentrating. Dizziness. Strong cravings for sugary foods and nicotine. Mild weight  gain. Constipation. Nausea. Coughing or a sore throat. Changes in how the medicines that you take for unrelated issues work in your body. Depression. Trouble sleeping (insomnia). Week 3 and afterward After the first 2-3 weeks of quitting, you may start to notice more positive results, such as: Improved sense of smell and taste. Decreased coughing and sore throat. Slower heart rate. Lower blood pressure. Clearer skin. The ability to breathe more easily. Fewer sick days. Quitting smoking can be very challenging. Do not get discouraged if you are not successful the first time. Some people need to make many attempts to quit before they achieve long-term success. Do your best to stick to your quit plan, and talk with your health care provider if you have any questions or concerns. Summary Smoking tobacco is the leading cause of preventable death. Quitting smoking is one of the best things that you can do for your health. When you decide to quit smoking, create a plan to help you succeed. Quit smoking right away, not slowly over a period of time. When you start quitting, seek help from your health care provider, family, or friends. This information is not intended to replace advice given to you by your health care provider. Make sure you discuss any questions you have with your health care provider. Document Revised: 12/16/2020 Document  Reviewed: 06/28/2018 Elsevier Patient Education  2022 Elsevier Inc.      Signed,   Meredith Staggers, MD [facility], [facility] 03/24/21 8:51 AM

## 2021-04-11 ENCOUNTER — Other Ambulatory Visit: Payer: Self-pay | Admitting: Family Medicine

## 2021-04-11 DIAGNOSIS — R7989 Other specified abnormal findings of blood chemistry: Secondary | ICD-10-CM

## 2021-04-19 ENCOUNTER — Telehealth: Payer: Self-pay

## 2021-04-19 ENCOUNTER — Other Ambulatory Visit (INDEPENDENT_AMBULATORY_CARE_PROVIDER_SITE_OTHER): Payer: Self-pay

## 2021-04-19 DIAGNOSIS — R7989 Other specified abnormal findings of blood chemistry: Secondary | ICD-10-CM

## 2021-04-19 NOTE — Telephone Encounter (Signed)
-----   Message from Shade Flood, MD sent at 04/11/2021  9:41 AM EST ----- Call patient. Blood sugar borderline low at last visit, liver tests were elevated.  Unfortunately I do not have any previous levels to compare to.  Please have him return for a lab only visit to recheck those levels this week if possible.  If he drinks any alcohol should cut back.  If any abdominal pain, nausea, vomiting, or yellowing of the skin or eyes should be seen right away.  Let me know if there are questions.

## 2021-04-19 NOTE — Telephone Encounter (Signed)
Patient returned call, he is aware of labs and scheduled today to repeat levels

## 2021-04-19 NOTE — Telephone Encounter (Signed)
Lvm for patient to return call about lab results

## 2021-04-20 LAB — COMPREHENSIVE METABOLIC PANEL
ALT: 128 U/L — ABNORMAL HIGH (ref 0–53)
AST: 124 U/L — ABNORMAL HIGH (ref 0–37)
Albumin: 4 g/dL (ref 3.5–5.2)
Alkaline Phosphatase: 81 U/L (ref 39–117)
BUN: 13 mg/dL (ref 6–23)
CO2: 25 mEq/L (ref 19–32)
Calcium: 9.2 mg/dL (ref 8.4–10.5)
Chloride: 102 mEq/L (ref 96–112)
Creatinine, Ser: 1.11 mg/dL (ref 0.40–1.50)
GFR: 72.13 mL/min (ref >60.00)
Glucose, Bld: 96 mg/dL (ref 70–99)
Potassium: 3.9 mEq/L (ref 3.5–5.1)
Sodium: 136 mEq/L (ref 135–145)
Total Bilirubin: 0.5 mg/dL (ref 0.2–1.2)
Total Protein: 7.9 g/dL (ref 6.0–8.3)

## 2021-04-25 ENCOUNTER — Telehealth: Payer: Self-pay

## 2021-04-25 DIAGNOSIS — R7989 Other specified abnormal findings of blood chemistry: Secondary | ICD-10-CM

## 2021-04-25 NOTE — Telephone Encounter (Signed)
Noted- continue to cut back with repeat LFT's in next 3-4 weeks - lab visit fine.

## 2021-04-25 NOTE — Telephone Encounter (Signed)
-----   Message from Shade Flood, MD sent at 04/25/2021 12:03 PM EST ----- Call patient  Liver test still elevated but lower than readings last month. How much alcohol is he drinking and has he cut back since initial visit with elevated readings?

## 2021-06-12 ENCOUNTER — Telehealth: Payer: Self-pay

## 2021-06-12 NOTE — Telephone Encounter (Signed)
Liver test still elevated but lower than readings last month. How much alcohol is he drinking and has he cut back since initial visit with elevated readings?

## 2021-06-12 NOTE — Telephone Encounter (Signed)
-----   Message from Shade Flood, MD sent at 06/10/2021  3:28 PM EST ----- Please see previous note.

## 2021-06-13 NOTE — Telephone Encounter (Signed)
Noted. Has future lab visit ordered- recommend having this performed in next few weeks.

## 2021-06-13 NOTE — Telephone Encounter (Signed)
Spoke with patient he sounded hesitant and stated that he had "kind of" reduced his indicated this was not much decrease as the patient was unable to give me a reference (I.e one bottle of beer rather than 2) pt willing to come back for more repeat testing if necessary

## 2021-06-13 NOTE — Telephone Encounter (Signed)
Pt scheduled for 06/29/21 lab only

## 2021-06-29 ENCOUNTER — Other Ambulatory Visit (INDEPENDENT_AMBULATORY_CARE_PROVIDER_SITE_OTHER): Payer: Self-pay

## 2021-06-29 DIAGNOSIS — R7989 Other specified abnormal findings of blood chemistry: Secondary | ICD-10-CM

## 2021-06-29 LAB — HEPATIC FUNCTION PANEL
ALT: 136 U/L — ABNORMAL HIGH (ref 0–53)
AST: 110 U/L — ABNORMAL HIGH (ref 0–37)
Albumin: 4.2 g/dL (ref 3.5–5.2)
Alkaline Phosphatase: 93 U/L (ref 39–117)
Bilirubin, Direct: 0.2 mg/dL (ref 0.0–0.3)
Total Bilirubin: 0.7 mg/dL (ref 0.2–1.2)
Total Protein: 8.2 g/dL (ref 6.0–8.3)

## 2021-07-04 ENCOUNTER — Other Ambulatory Visit: Payer: Self-pay | Admitting: Family Medicine

## 2021-07-04 DIAGNOSIS — R7989 Other specified abnormal findings of blood chemistry: Secondary | ICD-10-CM

## 2021-07-04 NOTE — Progress Notes (Signed)
See lab notes, elevated LFTs. ?

## 2021-07-27 ENCOUNTER — Ambulatory Visit
Admission: RE | Admit: 2021-07-27 | Discharge: 2021-07-27 | Disposition: A | Discharge status: Home / Self Care | Payer: No Typology Code available for payment source | Source: Ambulatory Visit | Attending: Family Medicine | Admitting: Family Medicine

## 2021-07-27 DIAGNOSIS — R7989 Other specified abnormal findings of blood chemistry: Secondary | ICD-10-CM

## 2021-09-22 ENCOUNTER — Ambulatory Visit (INDEPENDENT_AMBULATORY_CARE_PROVIDER_SITE_OTHER): Payer: Self-pay | Admitting: Family Medicine

## 2021-09-22 VITALS — BP 138/76 | HR 73 | Temp 98.2°F | Resp 16 | Ht 70.5 in | Wt 149.8 lb

## 2021-09-22 DIAGNOSIS — I10 Essential (primary) hypertension: Secondary | ICD-10-CM

## 2021-09-22 DIAGNOSIS — Z789 Other specified health status: Secondary | ICD-10-CM

## 2021-09-22 DIAGNOSIS — R7989 Other specified abnormal findings of blood chemistry: Secondary | ICD-10-CM

## 2021-09-22 DIAGNOSIS — R42 Dizziness and giddiness: Secondary | ICD-10-CM

## 2021-09-22 LAB — COMPREHENSIVE METABOLIC PANEL
ALT: 131 U/L — ABNORMAL HIGH (ref 0–53)
AST: 116 U/L — ABNORMAL HIGH (ref 0–37)
Albumin: 4.4 g/dL (ref 3.5–5.2)
Alkaline Phosphatase: 106 U/L (ref 39–117)
BUN: 12 mg/dL (ref 6–23)
CO2: 26 mEq/L (ref 19–32)
Calcium: 10.2 mg/dL (ref 8.4–10.5)
Chloride: 101 mEq/L (ref 96–112)
Creatinine, Ser: 1.1 mg/dL (ref 0.40–1.50)
GFR: 72.7 mL/min (ref >60.00)
Glucose, Bld: 91 mg/dL (ref 70–99)
Potassium: 4.4 mEq/L (ref 3.5–5.1)
Sodium: 135 mEq/L (ref 135–145)
Total Bilirubin: 0.5 mg/dL (ref 0.2–1.2)
Total Protein: 9.4 g/dL — ABNORMAL HIGH (ref 6.0–8.3)

## 2021-09-22 MED ORDER — AMLODIPINE BESYLATE 5 MG PO TABS: 5.0000 mg | ORAL_TABLET | Freq: Every day | ORAL | 2 refills | Status: DC

## 2021-09-22 MED ORDER — LISINOPRIL 10 MG PO TABS: 10.0000 mg | ORAL_TABLET | Freq: Every day | ORAL | 1 refills | Status: DC

## 2021-09-22 NOTE — Patient Instructions (Addendum)
Start drinking more water or flavored water with initial goal of 48 ounces per day. Cut back on coffee some as well. If lightheadedness/dizziness does not improve, come see me.   Continue to try to cut back on alcohol, and I will refer you to specialist for liver test elevation.   Return to the clinic or go to the nearest emergency room if any of your symptoms worsen or new symptoms occur.

## 2021-09-22 NOTE — Progress Notes (Signed)
Subjective:  Patient ID: Evan Bryant, male    DOB: 1960-05-08  Age: 61 y.o. 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  New job, notes some dizziness with bending down and standing up quick at times. Home readings:120-130/85 on meds.  Off meds past 2 days.  No regular water during the day - coffee and coke only. Up to 10 cups coffee per day.  BP Readings from Last 3 Encounters:  09/22/21 138/76  03/24/21 128/80  10/14/19 130/72   Lab Results  Component Value Date   CREATININE 1.11 04/19/2021   LFT elevation AST 110, ALT 136 on 06/29/2021, similar to readings in December 2022 at 124, 128 respectively.  Abdominal ultrasound limited of the right upper quadrant performed July 27, 2021.  No no focal liver lesion identified, mild increased echogenicity of hepatic parenchyma suggesting hepatic steatosis.  Probable 3 mm gallbladder polyp. Alcohol: 1 pint per week - has cut back past 6 weeks. prior heavier use - up to gallon per week. No difficulty cutting back.  No abd pain. No jaundice/scleral icterus.    History Patient Active Problem List   Diagnosis Date Noted   Hypertension 10/14/2019   Erectile dysfunction 10/14/2019   Family history of early CAD 10/14/2019   Tobacco abuse disorder 10/14/2019   Past Medical History:  Diagnosis Date   Hypertension    Past Surgical History:  Procedure Laterality Date   APPENDECTOMY     30s   No Known Allergies Prior to Admission medications   Medication Sig Start Date End Date Taking? Authorizing Provider  amLODipine (NORVASC) 5 MG tablet Take 1 tablet (5 mg total) by mouth daily. 03/24/21  Yes Shade Flood, MD  lisinopril (ZESTRIL) 10 MG tablet Take 1 tablet (10 mg total) by mouth daily. 03/24/21  Yes Shade Flood, MD  sildenafil (REVATIO) 20 MG tablet TAKE 1-2 TABLET BY MOUTH AS DIRECTED FOR ED. NO MORE THAN 1 DOSE IN 24 HOUR PERIOD. 11/09/19  Yes Waldon Merl, PA-C   Social History   Socioeconomic History   Marital status: Married    Spouse name: Not on file   Number of children: Not on file   Years of education: Not on file   Highest education level: Not on file  Occupational History   Not on file  Tobacco Use   Smoking status: Every Day    Types: Cigarettes, Cigars   Smokeless tobacco: Never  Vaping Use   Vaping Use: Every day  Substance and Sexual Activity   Alcohol use: Yes    Alcohol/week: 15.0 standard drinks    Types: 15 Shots of liquor per week   Drug use: Never   Sexual activity: Yes    Birth control/protection: None  Other Topics Concern   Not on file  Social History Narrative   Not on file   Social Determinants of Health   Financial Resource Strain: Not on file  Food Insecurity: Not on file  Transportation Needs: Not on file  Physical Activity: Not on file  Stress: Not on file  Social Connections: Not on file  Intimate Partner Violence: Not on file    Review of Systems  Constitutional:  Negative for fatigue and unexpected weight change.  Eyes:  Negative for visual disturbance.  Respiratory:  Negative for cough, chest tightness and shortness of breath.   Cardiovascular:  Negative for chest pain, palpitations and leg swelling.  Gastrointestinal:  Negative for abdominal pain and blood in stool.  Neurological:  Positive for light-headedness (episodic as above.). Negative for dizziness and headaches.    Objective:   Vitals:   09/22/21 0850  BP: 138/76  Pulse: 73  Resp: 16  Temp: 98.2 F (36.8 C)  TempSrc: Oral  SpO2: 98%  Weight: 149 lb 12.8 oz (67.9 kg)  Height: 5' 10.5" (1.791 m)     Physical Exam Vitals reviewed.   Constitutional:      Appearance: He is well-developed.  HENT:     Head: Normocephalic and atraumatic.  Neck:     Vascular: No carotid bruit or JVD.  Cardiovascular:     Rate and Rhythm: Normal rate and regular rhythm.     Heart sounds: Normal heart sounds. No murmur heard. Pulmonary:     Effort: Pulmonary effort is normal.     Breath sounds: Normal breath sounds. No rales.  Musculoskeletal:     Right lower leg: No edema.     Left lower leg: No edema.  Skin:    General: Skin is warm and dry.  Neurological:     Mental Status: He is alert and oriented to person, place, and time.  Psychiatric:        Mood and Affect: Mood normal.       Assessment & Plan:  [name] is a 62 y.o. male . Elevated LFTs - Plan: Comprehensive metabolic panel, Hepatitis B surface antigen, Hepatitis C antibody, Hepatitis B surface antibody,quantitative, Ambulatory referral to Gastroenterology Alcohol use  -Possible component of alcohol liver disease.  Ultrasound noted.commended on decreased alcohol but continue to work on last intake.  Repeat LFTs.    Check hepatitis testing.  Refer to GI to decide on further testing/imaging.  Essential hypertension - Plan: Comprehensive metabolic panel, lisinopril (ZESTRIL) 10 MG tablet, amLODipine (NORVASC) 5 MG tablet  -Stable on meds.  Slight increased reading in office off medication past few days.  Episodic lightheadedness likely due to relative volume depletion with absence of water intake and large amount of caffeine intake.  Cut back on coffee intake, increased water intake discussed.  RTC precautions if lightheadedness recurs, continue same regimen on meds for now with 78-month follow-up  Episodic lightheadedness  -New concern, treatment as above.  Meds ordered this encounter  Medications   lisinopril (ZESTRIL) 10 MG tablet    Sig: Take 1 tablet (10 mg total) by mouth daily.    Dispense:  90 tablet    Refill:  1   amLODipine (NORVASC) 5 MG tablet     Sig: Take 1 tablet (5 mg total) by mouth daily.    Dispense:  90 tablet    Refill:  2   Patient Instructions  Start drinking more water or flavored water with initial goal of 48 ounces per day. Cut back on coffee some as well. If lightheadedness/dizziness does not improve, come see me.   Continue to try to cut back on alcohol, and I will refer you to specialist for liver test elevation.   Return to the clinic or go to the nearest emergency room if any of your symptoms worsen  or new symptoms occur.        Signed,   Meredith StaggersJeffrey Kiwana Deblasi, MD [facility], [facility] 09/22/21 9:30 AM

## 2021-09-28 LAB — HCV RNA,QUANTITATIVE REAL TIME PCR
HCV Quantitative Log: 5.53 Log IU/mL — ABNORMAL HIGH
HCV RNA, PCR, QN: 336000 IU/mL — ABNORMAL HIGH

## 2021-09-28 LAB — HEPATITIS C ANTIBODY
Hepatitis C Ab: REACTIVE — AB
SIGNAL TO CUT-OFF: >11 — ABNORMAL HIGH (ref <1.00)

## 2021-09-28 LAB — HEPATITIS B SURFACE ANTIBODY, QUANTITATIVE: Hep B S AB Quant (Post): <5 m[IU]/mL — ABNORMAL LOW (ref >=10)

## 2021-09-28 LAB — HEPATITIS B SURFACE ANTIGEN: Hepatitis B Surface Ag: NONREACTIVE

## 2021-10-06 ENCOUNTER — Other Ambulatory Visit: Payer: Self-pay | Admitting: Family Medicine

## 2021-10-06 DIAGNOSIS — B1920 Unspecified viral hepatitis C without hepatic coma: Secondary | ICD-10-CM

## 2021-10-06 NOTE — Progress Notes (Signed)
See labs 

## 2021-10-10 ENCOUNTER — Encounter: Payer: Self-pay | Admitting: Family

## 2021-10-10 ENCOUNTER — Other Ambulatory Visit (HOSPITAL_COMMUNITY): Payer: Self-pay

## 2021-10-10 ENCOUNTER — Ambulatory Visit (INDEPENDENT_AMBULATORY_CARE_PROVIDER_SITE_OTHER): Payer: Self-pay | Admitting: Family

## 2021-10-10 ENCOUNTER — Other Ambulatory Visit: Payer: Self-pay

## 2021-10-10 VITALS — BP 151/92 | HR 70 | Temp 97.7°F | Wt 142.0 lb

## 2021-10-10 DIAGNOSIS — I10 Essential (primary) hypertension: Secondary | ICD-10-CM

## 2021-10-10 DIAGNOSIS — B182 Chronic viral hepatitis C: Secondary | ICD-10-CM

## 2021-10-10 NOTE — Progress Notes (Signed)
Subjective:    Patient ID: Evan Bryant, male    DOB: [birth year], 61 y.o.   MRN: [id number]  Chief Complaint  Patient presents with   Hepatitis C    HPI:  Evan Bryant is a 61 y.o. male with previous medical history of hypertension and tobacco use presenting today for initial evaluation of Hepatitis C.   Evan Bryant was initially noted to have elevated LFTs following an office visit on 03/24/21 with Internal Medicine. AST was 205 and ALT 216. On follow up lab work completed on 04/19/21 AST was 124 and ALT 128. Recommendation was to cut back on alcohol at that time. LFTs continued to be elevated on 06/29/21 with AST 110 and ALT 136. [place] 07/27/21 with probable hepatic steatosis  with no lesions. Hepatitis testing with negative Hepatitis B antigen and not immune to Hepatitis B and positive Hepatitis C antibody with Hepatitis C RNA level of 336,000. This was the first he was diagnosed with Hepatitis C. Risk factors include previous IV drug use and age being born between 3 and 33. No previous treatment and without symptoms. Denies abdominal pain, nausea, vomiting, fatigue, scleral icterus, or jaundice. No personal history of liver disease. Drinks about 1/5 of alcohol per week with no recreational or illicit drug use with every day tobacco use.    No Known Allergies    Outpatient Medications Prior to Visit  Medication Sig Dispense Refill   amLODipine (NORVASC) 5 MG tablet Take 1 tablet (5 mg total) by mouth daily. 90 tablet 2   lisinopril (ZESTRIL) 10 MG tablet Take 1 tablet (10 mg total) by mouth daily. 90 tablet 1   sildenafil (REVATIO) 20 MG tablet TAKE 1-2 TABLET BY MOUTH AS DIRECTED FOR ED. NO MORE THAN 1 DOSE IN 24 HOUR PERIOD. 10 tablet 0   No facility-administered medications prior to visit.     Past Medical History:  Diagnosis Date   Hypertension       Past Surgical History:  Procedure Laterality Date   APPENDECTOMY     30s      Family History  Problem Relation Age  of Onset   Arthritis Mother    Hearing loss Mother    Heart disease Mother    Hyperlipidemia Mother    Hypertension Mother    Kidney disease Mother    Miscarriages / [place] Mother    Stroke Mother    Heart attack Father    Heart disease Father    Hypertension Father    Stroke Father    Asthma Sister    Early death Sister    Heart disease Sister    Hypertension Sister    Alcohol abuse Brother    Asthma Brother    Depression Brother    Drug abuse Brother    Heart attack Brother    Hypertension Brother    Mental illness Brother    Stroke Brother    Early death Son    Cancer Sister        Breast Cancer   Heart attack Sister    Heart disease Sister    Hypertension Sister       Social History   Socioeconomic History   Marital status: Married    Spouse name: Not on file   Number of children: Not on file   Years of education: Not on file   Highest education level: Not on file  Occupational History   Not on file  Tobacco Use   Smoking status:  Every Day    Types: Cigarettes, Cigars   Smokeless tobacco: Never  Vaping Use   Vaping Use: Every day  Substance and Sexual Activity   Alcohol use: Yes    Alcohol/week: 15.0 standard drinks of alcohol    Types: 15 Shots of liquor per week   Drug use: Never   Sexual activity: Yes    Birth control/protection: None  Other Topics Concern   Not on file  Social History Narrative   Not on file   Social Determinants of Health   Financial Resource Strain: Not on file  Food Insecurity: Not on file  Transportation Needs: Not on file  Physical Activity: Not on file  Stress: Not on file  Social Connections: Not on file  Intimate Partner Violence: Not on file      Review of Systems  Constitutional:  Negative for appetite change, chills, fatigue, fever and unexpected weight change.  Eyes:  Negative for visual disturbance.  Respiratory:  Negative for cough, chest tightness, shortness of breath and wheezing.    Cardiovascular:  Negative for chest pain and leg swelling.  Gastrointestinal:  Negative for abdominal pain, constipation, diarrhea, nausea and vomiting.  Genitourinary:  Negative for dysuria, flank pain, frequency, genital sores, hematuria and urgency.  Skin:  Negative for rash.  Allergic/Immunologic: Negative for immunocompromised state.  Neurological:  Negative for dizziness and headaches.       Objective:    BP (!) 151/92   Pulse 70   Temp 97.7 F (36.5 C) (Oral)   Wt 142 lb (64.4 kg)   SpO2 99%   BMI 20.09 kg/m  Nursing note and vital signs reviewed.  Physical Exam Constitutional:      General: He is not in acute distress.    Appearance: He is well-developed.  Eyes:     Conjunctiva/sclera: Conjunctivae normal.  Cardiovascular:     Rate and Rhythm: Normal rate and regular rhythm.     Heart sounds: Normal heart sounds. No murmur heard.    No friction rub. No gallop.  Pulmonary:     Effort: Pulmonary effort is normal. No respiratory distress.     Breath sounds: Normal breath sounds. No wheezing or rales.  Chest:     Chest wall: No tenderness.  Abdominal:     General: Bowel sounds are normal.     Palpations: Abdomen is soft.     Tenderness: There is no abdominal tenderness.  Musculoskeletal:     Cervical back: Neck supple.  Lymphadenopathy:     Cervical: No cervical adenopathy.  Skin:    General: Skin is warm and dry.     Findings: No rash.  Neurological:     Mental Status: He is alert and oriented to person, place, and time.  Psychiatric:        Behavior: Behavior normal.        Thought Content: Thought content normal.        Judgment: Judgment normal.         Assessment & Plan:   Patient Active Problem List   Diagnosis Date Noted   Chronic hepatitis C without hepatic coma (HCC) 10/10/2021   Hypertension 10/14/2019   Erectile dysfunction 10/14/2019   Family history of early CAD 10/14/2019   Tobacco abuse disorder 10/14/2019     Problem List  Items Addressed This Visit       Cardiovascular and Mediastinum   Hypertension    Blood pressure mildly elevated with no neurological or ophthalmology current symptoms.  Continue management  per primary care.        Digestive   Chronic hepatitis C without hepatic coma (HCC) - Primary    Evan Bryant is a 61 year old gentleman with chronic hepatitis C with risk factors including previous history of IV drug use and being born between [birth year] and 1965.  Treatment nave and asymptomatic.  We reviewed the pathogenesis, risks of left untreated, transmission, treatment options, and financial assistance available along with treatment plan for hepatitis C.  Check blood work today including HIV and remaining hepatitis C lab work including genotype and fibrosis score.  Completed medication assistance forms for Mavyret.  He will also need [facility] financial assistance for lab work.  Plan for follow-up 1 month after start of Mavyret pending lab work results.      Relevant Orders   Hepatitis C genotype   HIV Antibody (routine testing w rflx)   Liver Fibrosis, FibroTest-ActiTest   Protime-INR   Hepatic function panel   CBC     I have discontinued Isaid L. Coppinger's sildenafil. I am also having him maintain his lisinopril and amLODipine.   Follow-up: 1 month after starting lab work or sooner if needed.    Marcos Eke, MSN, FNP-C Nurse Practitioner [facility] for [facility] Main number: [phone]

## 2021-10-10 NOTE — Assessment & Plan Note (Signed)
Mr. Evan Bryant is a 61 year old gentleman with chronic hepatitis C with risk factors including previous history of IV drug use and being born between [birth year] and 1965.  Treatment nave and asymptomatic.  We reviewed the pathogenesis, risks of left untreated, transmission, treatment options, and financial assistance available along with treatment plan for hepatitis C.  Check blood work today including HIV and remaining hepatitis C lab work including genotype and fibrosis score.  Completed medication assistance forms for Mavyret.  He will also need [facility] financial assistance for lab work.  Plan for follow-up 1 month after start of Mavyret pending lab work results.

## 2021-10-10 NOTE — Assessment & Plan Note (Signed)
Blood pressure mildly elevated with no neurological or ophthalmology current symptoms.  Continue management per primary care.

## 2021-10-10 NOTE — Patient Instructions (Addendum)
Nice to see you.  We will check your lab work today.  Plan for follow up in 1 month after the start of treatment.   Have a great day and stay safe!   Limit acetaminophen (Tylenol) usage to no more than 2 grams (2,000 mg) per day.  Avoid alcohol.  Do not share toothbrushes or razors.  Practice safe sex to protect against transmission as well as sexually transmitted disease.    Hepatitis C Hepatitis C is a viral infection of the liver. It can lead to scarring of the liver (cirrhosis), liver failure, or liver cancer. Hepatitis C may go undetected for months or years because people with the infection may not have symptoms, or they may have only mild symptoms. What are the causes? This condition is caused by the hepatitis C virus (HCV). The virus can spread from person to person (is contagious) through: Blood. Childbirth. A woman who has hepatitis C can pass it to her baby during birth. Bodily fluids, such as breast milk, tears, semen, vaginal fluids, and saliva. Blood transfusions or organ transplants done in the [place] before 1992.  What increases the risk? The following factors may make you more likely to develop this condition: Having contact with unclean (contaminated) needles or syringes. This may result from: Acupuncture. Tattoing. Body piercing. Injecting drugs. Having unprotected sex with someone who is infected. Needing treatment to filter your blood (kidney dialysis). Having HIV (human immunodeficiency virus) or AIDS (acquired immunodeficiency syndrome). Working in a job that involves contact with blood or bodily fluids, such as health care.  What are the signs or symptoms? Symptoms of this condition include: Fatigue. Loss of appetite. Nausea. Vomiting. Abdominal pain. Dark yellow urine. Yellowish skin and eyes (jaundice). Itchy skin. Clay-colored bowel movements. Joint pain. Bleeding and bruising easily. Fluid building up in your stomach  (ascites).  In some cases, you may not have any symptoms. How is this diagnosed? This condition is diagnosed with: Blood tests. Other tests to check how well your liver is functioning. They may include: Magnetic resonance elastography (MRE). This imaging test uses MRIs and sound waves to measure liver stiffness. Transient elastography. This imaging test uses ultrasounds to measure liver stiffness. Liver biopsy. This test requires taking a small tissue sample from your liver to examine it under a microscope.  How is this treated? Your health care provider may perform noninvasive tests or a liver biopsy to help decide the best course of treatment. Treatment may include: Antiviral medicines and other medicines. Follow-up treatments every 6-12 months for infections or other liver conditions. Receiving a donated liver (liver transplant).  Follow these instructions at home: Medicines Take over-the-counter and prescription medicines only as told by your health care provider. Take your antiviral medicine as told by your health care provider. Do not stop taking the antiviral even if you start to feel better. Do not take any medicines unless approved by your health care provider, including over-the-counter medicines and birth control pills. Activity Rest as needed. Do not have sex unless approved by your health care provider. Ask your health care provider when you may return to school or work. Eating and drinking Eat a balanced diet with plenty of fruits and vegetables, whole grains, and lowfat (lean) meats or non-meat proteins (such as beans or tofu). Drink enough fluids to keep your urine clear or pale yellow. Do not drink alcohol. General instructions Do not share toothbrushes, nail clippers, or razors. Wash your hands frequently with soap and water. If soap and  water are not available, use hand sanitizer. Cover any cuts or open sores on your skin to prevent spreading the virus. Keep all  follow-up visits as told by your health care provider. This is important. You may need follow-up visits every 6-12 months. How is this prevented? There is no vaccine for hepatitis C. The only way to prevent the disease is to reduce the risk of exposure to the virus. Make sure you: Wash your hands frequently with soap and water. If soap and water are not available, use hand sanitizer. Do not share needles or syringes. Practice safe sex and use condoms. Avoid handling blood or bodily fluids without gloves or other protection. Avoid getting tattoos or piercings in shops or other locations that are not clean.  Contact a health care provider if: You have a fever. You develop abdominal pain. You pass dark urine. You pass clay-colored stools. You develop joint pain. Get help right away if: You have increasing fatigue or weakness. You lose your appetite. You cannot eat or drink without vomiting. You develop jaundice or your jaundice gets worse. You bruise or bleed easily. Summary Hepatitis C is a viral infection of the liver. It can lead to scarring of the liver (cirrhosis), liver failure, or liver cancer. The hepatitis C virus (HCV) causes this condition. The virus can pass from person to person (is contagious). You should not take any medicines unless approved by your health care provider. This includes over-the-counter medicines and birth control pills. This information is not intended to replace advice given to you by your health care provider. Make sure you discuss any questions you have with your health care provider. Document Released: 04/06/2000 Document Revised: 05/15/2016 Document Reviewed: 05/15/2016 Elsevier Interactive Patient Education  [employer].

## 2021-10-16 LAB — HEPATIC FUNCTION PANEL
AG Ratio: 1.1 (calc) (ref 1.0–2.5)
ALT: 100 U/L — ABNORMAL HIGH (ref 9–46)
AST: 90 U/L — ABNORMAL HIGH (ref 10–35)
Albumin: 4.2 g/dL (ref 3.6–5.1)
Alkaline phosphatase (APISO): 99 U/L (ref 35–144)
Bilirubin, Direct: 0.1 mg/dL (ref 0.0–0.2)
Globulin: 4 g/dL (calc) — ABNORMAL HIGH (ref 1.9–3.7)
Indirect Bilirubin: 0.4 mg/dL (calc) (ref 0.2–1.2)
Total Bilirubin: 0.5 mg/dL (ref 0.2–1.2)
Total Protein: 8.2 g/dL — ABNORMAL HIGH (ref 6.1–8.1)

## 2021-10-16 LAB — LIVER FIBROSIS, FIBROTEST-ACTITEST
ALT: 101 U/L — ABNORMAL HIGH (ref 9–46)
Alpha-2-Macroglobulin: 489 mg/dL — ABNORMAL HIGH (ref 106–279)
Apolipoprotein A1: 157 mg/dL (ref 94–176)
Bilirubin: 0.5 mg/dL (ref 0.2–1.2)
Fibrosis Score: 0.69
GGT: 49 U/L (ref 3–70)
Haptoglobin: 154 mg/dL (ref 43–212)
Necroinflammat ACT Score: 0.71
Reference ID: 4426216

## 2021-10-16 LAB — CBC
HCT: 49 % (ref 38.5–50.0)
Hemoglobin: 16.8 g/dL (ref 13.2–17.1)
MCH: 33 pg (ref 27.0–33.0)
MCHC: 34.3 g/dL (ref 32.0–36.0)
MCV: 96.3 fL (ref 80.0–100.0)
MPV: 10.5 fL (ref 7.5–12.5)
Platelets: 215 10*3/uL (ref 140–400)
RBC: 5.09 10*6/uL (ref 4.20–5.80)
RDW: 12.1 % (ref 11.0–15.0)
WBC: 6.9 10*3/uL (ref 3.8–10.8)

## 2021-10-16 LAB — PROTIME-INR
INR: 1
Prothrombin Time: 10.3 s (ref 9.0–11.5)

## 2021-10-16 LAB — HEPATITIS C GENOTYPE

## 2021-10-16 LAB — HIV ANTIBODY (ROUTINE TESTING W REFLEX): HIV 1&2 Ab, 4th Generation: NONREACTIVE

## 2021-10-18 ENCOUNTER — Other Ambulatory Visit: Payer: Self-pay | Admitting: Family

## 2021-10-18 DIAGNOSIS — B182 Chronic viral hepatitis C: Secondary | ICD-10-CM

## 2021-10-19 ENCOUNTER — Telehealth: Payer: Self-pay

## 2021-10-19 NOTE — Telephone Encounter (Signed)
[facility] Patient Advocate Encounter  Completed and sent [employer] Path application for Epclusa for this patient who is uninsured.    Patient assistance phone number for follow up is [phone].   This encounter will be updated until final determination.   Zylpha Poynor, CPhT Specialty Pharmacy Patient [facility] Phone: [phone] Fax:  [phone]  

## 2021-10-20 ENCOUNTER — Telehealth: Payer: Self-pay

## 2021-10-20 NOTE — Telephone Encounter (Signed)
[facility] Patient Advocate Encounter  Completed and sent [employer] application for epclusa for this patient who is uninsured.    Patient is approved 10/20/21 through 01/12/22.  I faxed prescription to [facility].   Medication will be shipped to the clinic.   Clearance Coots, CPhT Specialty Pharmacy Patient [facility] for Infectious Disease Phone: [phone] Fax:  [phone]

## 2021-10-30 ENCOUNTER — Telehealth: Payer: Self-pay

## 2021-10-30 NOTE — Telephone Encounter (Signed)
[facility] Patient Advocate Encounter  Patient's medications have been couriered to [facility] from [facility] and will be picked up 11/09/21.  1st Epclusa box  Clearance Coots , CPhT Specialty Pharmacy Patient [facility] for Infectious Disease Phone: [phone] Fax:  [phone]

## 2021-11-09 ENCOUNTER — Other Ambulatory Visit: Payer: Self-pay

## 2021-11-09 ENCOUNTER — Encounter: Payer: Self-pay | Admitting: Family

## 2021-11-09 ENCOUNTER — Ambulatory Visit (INDEPENDENT_AMBULATORY_CARE_PROVIDER_SITE_OTHER): Payer: Self-pay | Admitting: Family

## 2021-11-09 VITALS — BP 152/84 | HR 67 | Temp 98.3°F | Ht 72.0 in | Wt 142.0 lb

## 2021-11-09 DIAGNOSIS — B182 Chronic viral hepatitis C: Secondary | ICD-10-CM

## 2021-11-09 MED ORDER — SOFOSBUVIR-VELPATASVIR 400-100 MG PO TABS: 1.0000 | ORAL_TABLET | Freq: Every day | ORAL | 2 refills | Status: DC

## 2021-11-09 NOTE — Progress Notes (Signed)
Subjective:    Patient ID: Evan Bryant, male    DOB: [birth year], 61 y.o.   MRN: [id number]  Chief Complaint  Patient presents with   Follow-up   Hepatitis C    HPI:  Evan Bryant is a 61 y.o. male with chronic Hepatitis C presenting today for follow up.  Evan Bryant has has been doing well since his last office visit. No current symptoms. Denies abdominal pain, nausea, vomiting, diarrhea, fatigue, scleral icterus, or jaundice. Eager to start medication.    No Known Allergies    Outpatient Medications Prior to Visit  Medication Sig Dispense Refill   amLODipine (NORVASC) 5 MG tablet Take 1 tablet (5 mg total) by mouth daily. 90 tablet 2   lisinopril (ZESTRIL) 10 MG tablet Take 1 tablet (10 mg total) by mouth daily. 90 tablet 1   No facility-administered medications prior to visit.     Past Medical History:  Diagnosis Date   Hypertension      Past Surgical History:  Procedure Laterality Date   APPENDECTOMY     30s       Review of Systems  Constitutional:  Negative for chills, diaphoresis, fatigue and fever.  Respiratory:  Negative for cough, chest tightness, shortness of breath and wheezing.   Cardiovascular:  Negative for chest pain.  Gastrointestinal:  Negative for abdominal distention, abdominal pain, constipation, diarrhea, nausea and vomiting.  Neurological:  Negative for weakness and headaches.  Hematological:  Does not bruise/bleed easily.      Objective:    BP (!) 152/84   Pulse 67   Temp 98.3 F (36.8 C) (Oral)   Ht 6' (1.829 m)   Wt 142 lb (64.4 kg)   SpO2 99%   BMI 19.26 kg/m  Nursing note and vital signs reviewed.  Physical Exam Constitutional:      General: He is not in acute distress.    Appearance: He is well-developed.  Cardiovascular:     Rate and Rhythm: Normal rate and regular rhythm.     Heart sounds: Normal heart sounds. No murmur heard.    No friction rub. No gallop.  Pulmonary:     Effort: Pulmonary effort is normal.  No respiratory distress.     Breath sounds: Normal breath sounds. No wheezing or rales.  Chest:     Chest wall: No tenderness.  Abdominal:     General: Bowel sounds are normal. There is no distension.     Palpations: Abdomen is soft. There is no mass.     Tenderness: There is no abdominal tenderness. There is no guarding or rebound.  Skin:    General: Skin is warm and dry.  Neurological:     Mental Status: He is alert and oriented to person, place, and time.  Psychiatric:        Behavior: Behavior normal.        Thought Content: Thought content normal.        Judgment: Judgment normal.         09/22/2021    8:53 AM 03/24/2021    8:06 AM 10/14/2019    2:06 PM  Depression screen PHQ 2/9  Decreased Interest 0 1 0  Down, Depressed, Hopeless 0 1 0  PHQ - 2 Score 0 2 0  Altered sleeping  1   Tired, decreased energy  1   Change in appetite  0   Feeling bad or failure about yourself   0   Trouble concentrating  0  Moving slowly or fidgety/restless  0   Suicidal thoughts  0   PHQ-9 Score  4        Assessment & Plan:    Patient Active Problem List   Diagnosis Date Noted   Chronic hepatitis C without hepatic coma (HCC) 10/10/2021   Hypertension 10/14/2019   Erectile dysfunction 10/14/2019   Family history of early CAD 10/14/2019   Tobacco abuse disorder 10/14/2019     Problem List Items Addressed This Visit       Digestive   Chronic hepatitis C without hepatic coma (HCC) - Primary    Evan Bryant has been approved for 12 weeks of Epclusa and medication provided. Reviewed medication administration and potential side effects. Plan for follow up in 1 month or sooner if needed.       Relevant Medications   Sofosbuvir-Velpatasvir (EPCLUSA) 400-100 MG TABS     I am having Brett L. Bieda start on [employer]. I am also having him maintain his lisinopril and amLODipine.   Meds ordered this encounter  Medications   Sofosbuvir-Velpatasvir (EPCLUSA) 400-100 MG  TABS    Sig: Take 1 tablet by mouth daily. Take 1 tablet by mouth daily.    Dispense:  28 tablet    Refill:  2    Order Specific Question:   Supervising Provider    Answer:   Judyann Munson [[id number]]     Follow-up: Return in about 1 month (around 12/10/2021).   Marcos Eke, MSN, FNP-C Nurse Practitioner [facility] for Infectious Disease [facility] Main number: [phone]

## 2021-11-09 NOTE — Assessment & Plan Note (Signed)
Evan Bryant has been approved for 12 weeks of Epclusa and medication provided. Reviewed medication administration and potential side effects. Plan for follow up in 1 month or sooner if needed.

## 2021-11-09 NOTE — Addendum Note (Signed)
Addended by: Jeanine Luz D on: 11/09/2021 09:27 AM   Modules accepted: Orders

## 2021-11-09 NOTE — Patient Instructions (Addendum)
Nice to see you.  Start taking your medication daily.   Plan for follow up in 1 months or sooner if needed with lab work on the same day.  Have a great day and stay safe!

## 2021-11-17 ENCOUNTER — Telehealth: Payer: Self-pay

## 2021-11-17 NOTE — Telephone Encounter (Signed)
[facility] Patient Advocate Encounter  Patient's medications have been couriered to [facility] from [employer] and will be picked up 12/14/21.  2nd Epclusa box.  Clearance Coots , CPhT Specialty Pharmacy Patient [facility] for Infectious Disease Phone: [phone] Fax:  [phone]

## 2021-12-08 ENCOUNTER — Telehealth: Payer: Self-pay | Admitting: Pharmacist

## 2021-12-08 NOTE — Telephone Encounter (Signed)
Patient's specialty medication Dorita Fray) was delivered from [facility] and will be picked up on 8/22 at his next appointment.  Margarite Gouge, PharmD, CPP, BCIDP Clinical Pharmacist Practitioner Infectious Diseases Clinical Pharmacist [facility] for Infectious Disease

## 2021-12-12 ENCOUNTER — Encounter: Payer: Self-pay | Admitting: Family

## 2021-12-12 ENCOUNTER — Ambulatory Visit (INDEPENDENT_AMBULATORY_CARE_PROVIDER_SITE_OTHER): Payer: Self-pay | Admitting: Family

## 2021-12-12 ENCOUNTER — Other Ambulatory Visit: Payer: Self-pay

## 2021-12-12 VITALS — BP 129/78 | HR 82 | Temp 98.2°F | Resp 16 | Ht 72.0 in | Wt 141.6 lb

## 2021-12-12 DIAGNOSIS — B182 Chronic viral hepatitis C: Secondary | ICD-10-CM

## 2021-12-12 NOTE — Progress Notes (Signed)
Subjective:    Patient ID: Evan Bryant, male    DOB: Sep 07, 1960, 61 y.o.   MRN: [id number]  Chief Complaint  Patient presents with   Follow-up    HEP C    HPI:  Evan Bryant is a 61 y.o. male with chronic Hepatitis C recently started on Epclusa presenting today for 1 month follow up.  Evan Bryant has been taking his Epclusa as prescribed with 1 missed dose since his last office visit. No adverse side effects and picks up medication here at the clinic. Feeling well today with no new concerns/complaints. No current abdominal pain, nausea, vomiting, jaundice, scleral icterus, fatigue or malaise.   No Known Allergies    Outpatient Medications Prior to Visit  Medication Sig Dispense Refill   amLODipine (NORVASC) 5 MG tablet Take 1 tablet (5 mg total) by mouth daily. 90 tablet 2   lisinopril (ZESTRIL) 10 MG tablet Take 1 tablet (10 mg total) by mouth daily. 90 tablet 1   Sofosbuvir-Velpatasvir (EPCLUSA) 400-100 MG TABS Take 1 tablet by mouth daily. Take 1 tablet by mouth daily. 28 tablet 2   No facility-administered medications prior to visit.     Past Medical History:  Diagnosis Date   Hypertension      Past Surgical History:  Procedure Laterality Date   APPENDECTOMY     30s       Review of Systems  Constitutional:  Negative for chills, diaphoresis, fatigue and fever.  Respiratory:  Negative for cough, chest tightness, shortness of breath and wheezing.   Cardiovascular:  Negative for chest pain.  Gastrointestinal:  Negative for abdominal distention, abdominal pain, constipation, diarrhea, nausea and vomiting.  Neurological:  Negative for weakness and headaches.  Hematological:  Does not bruise/bleed easily.      Objective:    BP 129/78   Pulse 82   Temp 98.2 F (36.8 C) (Oral)   Resp 16   Ht 6' (1.829 m)   Wt 141 lb 9.6 oz (64.2 kg)   SpO2 98%   BMI 19.20 kg/m  Nursing note and vital signs reviewed.  Physical Exam Constitutional:      General: He  is not in acute distress.    Appearance: He is well-developed.  Cardiovascular:     Rate and Rhythm: Normal rate and regular rhythm.     Heart sounds: Normal heart sounds. No murmur heard.    No friction rub. No gallop.  Pulmonary:     Effort: Pulmonary effort is normal. No respiratory distress.     Breath sounds: Normal breath sounds. No wheezing or rales.  Chest:     Chest wall: No tenderness.  Abdominal:     General: Bowel sounds are normal. There is no distension.     Palpations: Abdomen is soft. There is no mass.     Tenderness: There is no abdominal tenderness. There is no guarding or rebound.  Skin:    General: Skin is warm and dry.  Neurological:     Mental Status: He is alert and oriented to person, place, and time.  Psychiatric:        Behavior: Behavior normal.        Thought Content: Thought content normal.        Judgment: Judgment normal.         12/12/2021    9:24 AM 09/22/2021    8:53 AM 03/24/2021    8:06 AM 10/14/2019    2:06 PM  Depression screen PHQ 2/9  Decreased Interest 0 0 1 0  Down, Depressed, Hopeless 0 0 1 0  PHQ - 2 Score 0 0 2 0  Altered sleeping   1   Tired, decreased energy   1   Change in appetite   0   Feeling bad or failure about yourself    0   Trouble concentrating   0   Moving slowly or fidgety/restless   0   Suicidal thoughts   0   PHQ-9 Score   4        Assessment & Plan:    Patient Active Problem List   Diagnosis Date Noted   Chronic hepatitis C without hepatic coma (HCC) 10/10/2021   Hypertension 10/14/2019   Erectile dysfunction 10/14/2019   Family history of early CAD 10/14/2019   Tobacco abuse disorder 10/14/2019     Problem List Items Addressed This Visit       Digestive   Chronic hepatitis C without hepatic coma (HCC) - Primary    Evan Bryant has completed 1 month of Eplcusa for treatment of chronic Hepatitis C with no adverse side effects. Feeling well. Check Hepatitis C RNA level. Second month of Epclusa  provided.  Reviewed plan of care and will pick up next refill of medication here at the clinic in 1 month and follow up with provider in 2 months at the end of treatment.       Relevant Orders   Comprehensive metabolic panel   Hepatitis C RNA quantitative     I am having Evan Bryant maintain his lisinopril, amLODipine, and Sofosbuvir-Velpatasvir.    Follow-up: Return in about 2 months (around 02/11/2022), or if symptoms worsen or fail to improve.   Marcos Eke, MSN, FNP-C Nurse Practitioner [facility] for Infectious Disease [facility] Main number: [phone]

## 2021-12-12 NOTE — Assessment & Plan Note (Addendum)
Evan Bryant has completed 1 month of Eplcusa for treatment of chronic Hepatitis C with no adverse side effects. Feeling well. Check Hepatitis C RNA level. Second month of Epclusa provided.  Reviewed plan of care and will pick up next refill of medication here at the clinic in 1 month and follow up with provider in 2 months at the end of treatment.

## 2021-12-12 NOTE — Patient Instructions (Signed)
Nice to see you.  We will check your lab work today.  Continue to take your medication daily as prescribed.  Pick up medication in 1 month for refill.   Plan for follow up in 2 months or sooner if needed with lab work on the same day.  Have a great day and stay safe!

## 2021-12-15 LAB — COMPREHENSIVE METABOLIC PANEL
AG Ratio: 1.1 (calc) (ref 1.0–2.5)
ALT: 38 U/L (ref 9–46)
AST: 37 U/L — ABNORMAL HIGH (ref 10–35)
Albumin: 4 g/dL (ref 3.6–5.1)
Alkaline phosphatase (APISO): 76 U/L (ref 35–144)
BUN: 11 mg/dL (ref 7–25)
CO2: 25 mmol/L (ref 20–32)
Calcium: 9.6 mg/dL (ref 8.6–10.3)
Chloride: 102 mmol/L (ref 98–110)
Creat: 1.21 mg/dL (ref 0.70–1.35)
Globulin: 3.6 g/dL (calc) (ref 1.9–3.7)
Glucose, Bld: 105 mg/dL — ABNORMAL HIGH (ref 65–99)
Potassium: 3.6 mmol/L (ref 3.5–5.3)
Sodium: 136 mmol/L (ref 135–146)
Total Bilirubin: 0.4 mg/dL (ref 0.2–1.2)
Total Protein: 7.6 g/dL (ref 6.1–8.1)

## 2021-12-15 LAB — HEPATITIS C RNA QUANTITATIVE
HCV Quantitative Log: <1.18 log IU/mL
HCV RNA, PCR, QN: <15 IU/mL

## 2022-02-22 ENCOUNTER — Encounter: Payer: Self-pay | Admitting: Family

## 2022-02-22 ENCOUNTER — Other Ambulatory Visit: Payer: Self-pay

## 2022-02-22 ENCOUNTER — Ambulatory Visit (INDEPENDENT_AMBULATORY_CARE_PROVIDER_SITE_OTHER): Payer: Self-pay | Admitting: Family

## 2022-02-22 VITALS — BP 150/82 | HR 98 | Temp 97.9°F | Wt 139.0 lb

## 2022-02-22 DIAGNOSIS — B182 Chronic viral hepatitis C: Secondary | ICD-10-CM

## 2022-02-22 NOTE — Patient Instructions (Signed)
Nice to see you.  We will check your lab work today.  Plan for follow up in 3 months or sooner if needed with lab work on the same day.  Have a great day and stay safe!  

## 2022-02-22 NOTE — Assessment & Plan Note (Signed)
Evan Bryant has completed 12 weeks of Epclusa with no adverse side effects. Check Hepaititis C RNA level today. Plan for follow up in 3 months or sooner if needed for confirmation of sustained viremic response.

## 2022-02-22 NOTE — Progress Notes (Signed)
Subjective:    Patient ID: Evan Bryant, male    DOB: Mar 04, 1961, 60 y.o.   MRN: [id number]  Chief Complaint  Patient presents with   Follow-up    Hep c    Hepatitis C    HPI:  Evan Bryant is a 61 y.o. male with chronic Hepatitis C last seen on 12/12/21 following the completion of his 1st month of Epclusa with good adherence and tolerance to the medication with Hepatitis C RNA level being undetectable. Continued on Epclusa for 2 additional months. Here today for follow up and end of treatment visit.   Evan Bryant has finished his Epclusa at the early part of last week. No adverse side effects and only 2 missed doses since his last office visit. Feeling well with new concerns/complaints. Denies abdominal pain, nausea, vomiting, fatigue, fever, scleral icterus or jaundice.     No Known Allergies    Outpatient Medications Prior to Visit  Medication Sig Dispense Refill   amLODipine (NORVASC) 5 MG tablet Take 1 tablet (5 mg total) by mouth daily. 90 tablet 2   lisinopril (ZESTRIL) 10 MG tablet Take 1 tablet (10 mg total) by mouth daily. 90 tablet 1   Sofosbuvir-Velpatasvir (EPCLUSA) 400-100 MG TABS Take 1 tablet by mouth daily. Take 1 tablet by mouth daily. (Patient not taking: Reported on 02/22/2022) 28 tablet 2   No facility-administered medications prior to visit.     Past Medical History:  Diagnosis Date   Hypertension      Past Surgical History:  Procedure Laterality Date   APPENDECTOMY     30s       Review of Systems  Constitutional:  Negative for chills, diaphoresis, fatigue and fever.  Respiratory:  Negative for cough, chest tightness, shortness of breath and wheezing.   Cardiovascular:  Negative for chest pain.  Gastrointestinal:  Negative for abdominal distention, abdominal pain, constipation, diarrhea, nausea and vomiting.  Neurological:  Negative for weakness and headaches.  Hematological:  Does not bruise/bleed easily.      Objective:    BP (!)  150/82   Pulse 98   Temp 97.9 F (36.6 C) (Temporal)   Wt 139 lb (63 kg)   SpO2 100%   BMI 18.85 kg/m  Nursing note and vital signs reviewed.  Physical Exam Constitutional:      General: He is not in acute distress.    Appearance: He is well-developed.  Cardiovascular:     Rate and Rhythm: Normal rate and regular rhythm.     Heart sounds: Normal heart sounds. No murmur heard.    No friction rub. No gallop.  Pulmonary:     Effort: Pulmonary effort is normal. No respiratory distress.     Breath sounds: Normal breath sounds. No wheezing or rales.  Chest:     Chest wall: No tenderness.  Abdominal:     General: Bowel sounds are normal. There is no distension.     Palpations: Abdomen is soft. There is no mass.     Tenderness: There is no abdominal tenderness. There is no guarding or rebound.  Skin:    General: Skin is warm and dry.  Neurological:     Mental Status: He is alert and oriented to person, place, and time.  Psychiatric:        Behavior: Behavior normal.        Thought Content: Thought content normal.        Judgment: Judgment normal.  12/12/2021    9:24 AM 09/22/2021    8:53 AM 03/24/2021    8:06 AM 10/14/2019    2:06 PM  Depression screen PHQ 2/9  Decreased Interest 0 0 1 0  Down, Depressed, Hopeless 0 0 1 0  PHQ - 2 Score 0 0 2 0  Altered sleeping   1   Tired, decreased energy   1   Change in appetite   0   Feeling bad or failure about yourself    0   Trouble concentrating   0   Moving slowly or fidgety/restless   0   Suicidal thoughts   0   PHQ-9 Score   4        Assessment & Plan:    Patient Active Problem List   Diagnosis Date Noted   Chronic hepatitis C without hepatic coma ([place]) 10/10/2021   Hypertension 10/14/2019   Erectile dysfunction 10/14/2019   Family history of early CAD 10/14/2019   Tobacco abuse disorder 10/14/2019     Problem List Items Addressed This Visit       Digestive   Chronic hepatitis C without hepatic coma  ([place]) - Primary    Evan Bryant has completed 12 weeks of Epclusa with no adverse side effects. Check Hepaititis C RNA level today. Plan for follow up in 3 months or sooner if needed for confirmation of sustained viremic response.      Relevant Orders   Hepatitis C RNA quantitative     I have discontinued Evan Bryant's Sofosbuvir-Velpatasvir. I am also having him maintain his lisinopril and amLODipine.    Follow-up: Return in about 3 months (around 05/25/2022).   Terri Piedra, MSN, FNP-C Nurse Practitioner [facility] for Infectious Disease [place] number: [phone]

## 2022-02-24 LAB — HEPATITIS C RNA QUANTITATIVE
HCV Quantitative Log: <1.18 log IU/mL
HCV RNA, PCR, QN: <15 IU/mL

## 2022-03-19 ENCOUNTER — Encounter: Payer: Self-pay | Admitting: Family Medicine

## 2022-03-19 ENCOUNTER — Ambulatory Visit (INDEPENDENT_AMBULATORY_CARE_PROVIDER_SITE_OTHER): Payer: Self-pay | Admitting: Family Medicine

## 2022-03-19 VITALS — BP 132/78 | HR 82 | Temp 97.8°F | Ht 72.0 in | Wt 141.4 lb

## 2022-03-19 DIAGNOSIS — B182 Chronic viral hepatitis C: Secondary | ICD-10-CM

## 2022-03-19 DIAGNOSIS — I10 Essential (primary) hypertension: Secondary | ICD-10-CM

## 2022-03-19 MED ORDER — AMLODIPINE BESYLATE 5 MG PO TABS: 5.0000 mg | ORAL_TABLET | Freq: Every day | ORAL | 2 refills | Status: DC

## 2022-03-19 MED ORDER — LISINOPRIL 10 MG PO TABS: 10.0000 mg | ORAL_TABLET | Freq: Every day | ORAL | 2 refills | Status: DC

## 2022-03-19 NOTE — Progress Notes (Signed)
Subjective:  Patient ID: Evan Bryant, male    DOB: [birth year]  Age: 61 y.o. MRN: [id number]  CC:  Chief Complaint  Patient presents with   Hypertension    Pt states all is well    HPI Evan Bryant presents for   Hypertension: Amlodipine 5 mg daily, lisinopril 10 mg daily. Home readings: only elevated off meds.  Has not taken meds yet today.  Usually 125/80.  BP Readings from Last 3 Encounters:  03/19/22 132/78  02/22/22 (!) 150/82  12/12/21 129/78   Lab Results  Component Value Date   CREATININE 1.21 12/12/2021  Labs with ID - chronic hep C. Treated with Eplcusa. Undetectable on 02/22/22 labs. Follow up in February. Lfts better in 11/2021.   History Patient Active Problem List   Diagnosis Date Noted   Chronic hepatitis C without hepatic coma (HCC) 10/10/2021   Hypertension 10/14/2019   Family history of early CAD 10/14/2019   Tobacco abuse disorder 10/14/2019   Past Medical History:  Diagnosis Date   Hypertension    Past Surgical History:  Procedure Laterality Date   APPENDECTOMY     30s   No Known Allergies Prior to Admission medications   Medication Sig Start Date End Date Taking? Authorizing Provider  amLODipine (NORVASC) 5 MG tablet Take 1 tablet (5 mg total) by mouth daily. 09/22/21  Yes Shade Flood, MD  lisinopril (ZESTRIL) 10 MG tablet Take 1 tablet (10 mg total) by mouth daily. 09/22/21  Yes Shade Flood, MD   Social History   Socioeconomic History   Marital status: Married    Spouse name: Not on file   Number of children: Not on file   Years of education: Not on file   Highest education level: Not on file  Occupational History   Not on file  Tobacco Use   Smoking status: Every Day    Packs/day: 1.00    Types: Cigarettes, Cigars   Smokeless tobacco: Never   Tobacco comments:    Reports he's been smoking since age 7  Vaping Use   Vaping Use: Every day  Substance and Sexual Activity   Alcohol use: Yes    Alcohol/week: 15.0  standard drinks of alcohol    Types: 15 Shots of liquor per week   Drug use: Never   Sexual activity: Yes    Birth control/protection: None  Other Topics Concern   Not on file  Social History Narrative   Not on file   Social Determinants of Health   Financial Resource Strain: Not on file  Food Insecurity: Not on file  Transportation Needs: Not on file  Physical Activity: Not on file  Stress: Not on file  Social Connections: Not on file  Intimate Partner Violence: Not on file    Review of Systems  Constitutional:  Negative for fatigue and unexpected weight change.  Eyes:  Negative for visual disturbance.  Respiratory:  Negative for cough, chest tightness and shortness of breath.   Cardiovascular:  Negative for chest pain, palpitations and leg swelling.  Gastrointestinal:  Negative for abdominal pain and blood in stool.  Neurological:  Negative for dizziness, light-headedness and headaches.     Objective:   Vitals:   03/19/22 0946  BP: 132/78  Pulse: 82  Temp: 97.8 F (36.6 C)  SpO2: 95%  Weight: 141 lb 6.4 oz (64.1 kg)  Height: 6' (1.829 m)     Physical Exam Vitals reviewed.  Constitutional:      Appearance:  He is well-developed.  HENT:     Head: Normocephalic and atraumatic.  Neck:     Vascular: No carotid bruit or JVD.  Cardiovascular:     Rate and Rhythm: Normal rate and regular rhythm.     Heart sounds: Normal heart sounds. No murmur heard. Pulmonary:     Effort: Pulmonary effort is normal.     Breath sounds: Normal breath sounds. No rales.  Musculoskeletal:     Right lower leg: No edema.     Left lower leg: No edema.  Skin:    General: Skin is warm and dry.  Neurological:     Mental Status: He is alert and oriented to person, place, and time.  Psychiatric:        Mood and Affect: Mood normal.        Assessment & Plan:  Evan Bryant is a 61 y.o. male . Chronic hepatitis C without hepatic coma (HCC)  Essential hypertension - Plan:  amLODipine (NORVASC) 5 MG tablet, lisinopril (ZESTRIL) 10 MG tablet Hypertension well-controlled.  Tolerating current meds.  Recent labs noted.  Hep C undetectable after treatment as above with infectious disease, continue follow-up as planned.  Continue lisinopril, amlodipine same doses with recheck in 6 months  Meds ordered this encounter  Medications   amLODipine (NORVASC) 5 MG tablet    Sig: Take 1 tablet (5 mg total) by mouth daily.    Dispense:  90 tablet    Refill:  2   lisinopril (ZESTRIL) 10 MG tablet    Sig: Take 1 tablet (10 mg total) by mouth daily.    Dispense:  90 tablet    Refill:  2   Patient Instructions  Blood pressure overall looks ok. No med changes. Recheck in 6 months. Take care and thanks for coming in today!     Signed,   Meredith Staggers, MD [facility], [facility] 03/19/22 10:45 AM

## 2022-03-19 NOTE — Patient Instructions (Signed)
Blood pressure overall looks ok. No med changes. Recheck in 6 months. Take care and thanks for coming in today!

## 2022-06-08 NOTE — Progress Notes (Unsigned)
Subjective:    Patient ID: Evan Bryant, male    DOB: April 22, 1961, 62 y.o.   MRN: [id number]  No chief complaint on file.   HPI:  Evan Bryant is a 62 y.o. male with chronic Hepatitis C last seen on 02/22/22 at the completion of 12 weeks of Epclusa with Hepatitis C RNA level undetectable. Here today for cure/SVR 12 visit.    No Known Allergies    Outpatient Medications Prior to Visit  Medication Sig Dispense Refill   amLODipine (NORVASC) 5 MG tablet Take 1 tablet (5 mg total) by mouth daily. 90 tablet 2   lisinopril (ZESTRIL) 10 MG tablet Take 1 tablet (10 mg total) by mouth daily. 90 tablet 2   No facility-administered medications prior to visit.     Past Medical History:  Diagnosis Date   Hypertension      Past Surgical History:  Procedure Laterality Date   APPENDECTOMY     30s       Review of Systems  Constitutional:  Negative for chills, diaphoresis, fatigue and fever.  Respiratory:  Negative for cough, chest tightness, shortness of breath and wheezing.   Cardiovascular:  Negative for chest pain.  Gastrointestinal:  Negative for abdominal distention, abdominal pain, constipation, diarrhea, nausea and vomiting.  Neurological:  Negative for weakness and headaches.  Hematological:  Does not bruise/bleed easily.      Objective:    There were no vitals taken for this visit. Nursing note and vital signs reviewed.  Physical Exam Constitutional:      General: He is not in acute distress.    Appearance: He is well-developed.  Cardiovascular:     Rate and Rhythm: Normal rate and regular rhythm.     Heart sounds: Normal heart sounds. No murmur heard.    No friction rub. No gallop.  Pulmonary:     Effort: Pulmonary effort is normal. No respiratory distress.     Breath sounds: Normal breath sounds. No wheezing or rales.  Chest:     Chest wall: No tenderness.  Abdominal:     General: Bowel sounds are normal. There is no distension.     Palpations: Abdomen  is soft. There is no mass.     Tenderness: There is no abdominal tenderness. There is no guarding or rebound.  Skin:    General: Skin is warm and dry.  Neurological:     Mental Status: He is alert and oriented to person, place, and time.  Psychiatric:        Behavior: Behavior normal.        Thought Content: Thought content normal.        Judgment: Judgment normal.         03/19/2022    9:45 AM 12/12/2021    9:24 AM 09/22/2021    8:53 AM 03/24/2021    8:06 AM 10/14/2019    2:06 PM  Depression screen PHQ 2/9  Decreased Interest 0 0 0 1 0  Down, Depressed, Hopeless 0 0 0 1 0  PHQ - 2 Score 0 0 0 2 0  Altered sleeping 0   1   Tired, decreased energy 0   1   Change in appetite 0   0   Feeling bad or failure about yourself  0   0   Trouble concentrating 0   0   Moving slowly or fidgety/restless 0   0   Suicidal thoughts 0   0   PHQ-9 Score 0  4        Assessment & Plan:    Patient Active Problem List   Diagnosis Date Noted   Chronic hepatitis C without hepatic coma ([place]) 10/10/2021   Hypertension 10/14/2019   Family history of early CAD 10/14/2019   Tobacco abuse disorder 10/14/2019     Problem List Items Addressed This Visit   None    I am having Evan Bryant maintain his amLODipine and lisinopril.   No orders of the defined types were placed in this encounter.    Follow-up: No follow-ups on file.   Terri Piedra, MSN, FNP-C Nurse Practitioner [facility] for Infectious Disease [place] number: [phone]

## 2022-06-12 ENCOUNTER — Encounter: Payer: Self-pay | Admitting: Family

## 2022-06-12 ENCOUNTER — Other Ambulatory Visit: Payer: Self-pay

## 2022-06-12 ENCOUNTER — Ambulatory Visit (INDEPENDENT_AMBULATORY_CARE_PROVIDER_SITE_OTHER): Payer: Self-pay | Admitting: Family

## 2022-06-12 VITALS — BP 156/93 | HR 91 | Ht 72.0 in | Wt 139.4 lb

## 2022-06-12 DIAGNOSIS — B182 Chronic viral hepatitis C: Secondary | ICD-10-CM

## 2022-06-12 NOTE — Patient Instructions (Signed)
Nice to see you.  We will check your lab work today.  Recommend routine liver cancer screening every 6 months with ultrasound with or without lab work (alpha fetoprotein).   If you need to be tested for Hepatitis C in the future a Hepatitis C RNA level or viral load will need to be checked as the Hepatitis C antibody used for initial screening will always be positive.   Follow up with ID as needed pending lab work.   Have a great day and stay safe!

## 2022-06-14 LAB — HEPATITIS C RNA QUANTITATIVE
HCV Quantitative Log: <1.18 log IU/mL
HCV RNA, PCR, QN: <15 IU/mL

## 2022-09-14 ENCOUNTER — Ambulatory Visit (INDEPENDENT_AMBULATORY_CARE_PROVIDER_SITE_OTHER): Payer: Self-pay | Admitting: Family Medicine

## 2022-09-14 VITALS — BP 126/74 | HR 90 | Temp 98.2°F | Ht 72.0 in | Wt 133.6 lb

## 2022-09-14 DIAGNOSIS — Z720 Tobacco use: Secondary | ICD-10-CM

## 2022-09-14 DIAGNOSIS — I10 Essential (primary) hypertension: Secondary | ICD-10-CM

## 2022-09-14 DIAGNOSIS — B182 Chronic viral hepatitis C: Secondary | ICD-10-CM

## 2022-09-14 LAB — BASIC METABOLIC PANEL
BUN: 10 mg/dL (ref 6–23)
CO2: 24 mEq/L (ref 19–32)
Calcium: 9.5 mg/dL (ref 8.4–10.5)
Chloride: 102 mEq/L (ref 96–112)
Creatinine, Ser: 1.05 mg/dL (ref 0.40–1.50)
GFR: 76.35 mL/min (ref >60.00)
Glucose, Bld: 76 mg/dL (ref 70–99)
Potassium: 3.9 mEq/L (ref 3.5–5.1)
Sodium: 138 mEq/L (ref 135–145)

## 2022-09-14 MED ORDER — AMLODIPINE BESYLATE 5 MG PO TABS: 5.0000 mg | ORAL_TABLET | Freq: Every day | ORAL | 2 refills | Status: DC

## 2022-09-14 MED ORDER — LISINOPRIL 10 MG PO TABS: 10.0000 mg | ORAL_TABLET | Freq: Every day | ORAL | 2 refills | Status: DC

## 2022-09-14 NOTE — Progress Notes (Signed)
Subjective:  Patient ID: Evan Bryant, male    DOB: February 15, 1961  Age: 62 y.o. MRN: [id number]  CC:  Chief Complaint  Patient presents with   Hypertension    HPI Evan Bryant presents for    Hypertension: Amlodipine 5mg  qd, lisinopril 10mg  qd.  No new side effects.  Home readings:120-130/70-90 Borderline glucose 105 in 11/2021. Fasting today.  Taking aspirin per day.  Declines lipid panel today.  BP Readings from Last 3 Encounters:  09/14/22 126/74  06/12/22 (!) 156/93  03/19/22 132/78   Lab Results  Component Value Date   CREATININE 1.21 12/12/2021   Nicotine addiction: Still smoking 2 packs per day. Declines quitting or assistance, discussed health risks.   Alcohol: 1/5 of liquor per week. 12 drinks per week.  No DUI, no legal issues.  Denies need/feeling to cut back.  Denies agitation about alcohol discussion, no guilt, no eye opener.   Chronic hep C: S/p treatment with ID - Epclusa last year. Hep C RNA undetectable.   HM: Declines colon CA screening Declines covid, tetanus and shingles vaccines.    History Patient Active Problem List   Diagnosis Date Noted   Chronic hepatitis C without hepatic coma (HCC) 10/10/2021   Hypertension 10/14/2019   Family history of early CAD 10/14/2019   Tobacco abuse disorder 10/14/2019   Past Medical History:  Diagnosis Date   Hypertension    Past Surgical History:  Procedure Laterality Date   APPENDECTOMY     30s   No Known Allergies Prior to Admission medications   Medication Sig Start Date End Date Taking? Authorizing Provider  amLODipine (NORVASC) 5 MG tablet Take 1 tablet (5 mg total) by mouth daily. 03/19/22  Yes Shade Flood, MD  lisinopril (ZESTRIL) 10 MG tablet Take 1 tablet (10 mg total) by mouth daily. 03/19/22  Yes Shade Flood, MD   Social History   Socioeconomic History   Marital status: Married    Spouse name: Not on file   Number of children: Not on file   Years of education: Not  on file   Highest education level: Not on file  Occupational History   Not on file  Tobacco Use   Smoking status: Every Day    Packs/day: 1    Types: Cigarettes, Cigars   Smokeless tobacco: Never   Tobacco comments:    Reports he's been smoking since age 62  Vaping Use   Vaping Use: Every day  Substance and Sexual Activity   Alcohol use: Yes    Alcohol/week: 15.0 standard drinks of alcohol    Types: 15 Shots of liquor per week   Drug use: Never   Sexual activity: Yes    Birth control/protection: None    Comment: declined condoms  Other Topics Concern   Not on file  Social History Narrative   Not on file   Social Determinants of Health   Financial Resource Strain: Not on file  Food Insecurity: Not on file  Transportation Needs: Not on file  Physical Activity: Not on file  Stress: Not on file  Social Connections: Not on file  Intimate Partner Violence: Not on file    Review of Systems  Constitutional:  Negative for fatigue and unexpected weight change.  Eyes:  Negative for visual disturbance.  Respiratory:  Negative for cough, chest tightness and shortness of breath.   Cardiovascular:  Negative for chest pain, palpitations and leg swelling.  Gastrointestinal:  Negative for abdominal pain and blood in  stool.  Neurological:  Negative for dizziness, light-headedness and headaches.     Objective:   Vitals:   09/14/22 0803  BP: 126/74  Pulse: 90  Temp: 98.2 F (36.8 C)  TempSrc: Temporal  SpO2: 98%  Weight: 133 lb 9.6 oz (60.6 kg)  Height: 6' (1.829 m)     Physical Exam Vitals reviewed.  Constitutional:      Appearance: He is well-developed.  HENT:     Head: Normocephalic and atraumatic.  Neck:     Vascular: No carotid bruit or JVD.  Cardiovascular:     Rate and Rhythm: Normal rate and regular rhythm.     Heart sounds: Normal heart sounds. No murmur heard. Pulmonary:     Effort: Pulmonary effort is normal.     Breath sounds: Normal breath sounds. No  rales.  Musculoskeletal:     Right lower leg: No edema.     Left lower leg: No edema.  Skin:    General: Skin is warm and dry.  Neurological:     Mental Status: He is alert and oriented to person, place, and time.  Psychiatric:        Mood and Affect: Mood normal.        Assessment & Plan:  Evan Bryant is a 62 y.o. male . Essential hypertension - Plan: amLODipine (NORVASC) 5 MG tablet, lisinopril (ZESTRIL) 10 MG tablet, Basic metabolic panel  -Stable on current regimen.  Continue same, check labs.  Recommended repeat lipid screening at some point, deferred today.  Health screening fair may be an option.  Cardiac risk factors discussed, asymptomatic at this time.  RTC/ER precautions given.  Tobacco abuse disorder  -Health risks discussed, precontemplative regarding cessation, to let me know if he is willing or ready to quit and happy to provide assistance if needed.  Chronic hepatitis C without hepatic coma (HCC)  -Status post treatment as above, asymptomatic.  Meds ordered this encounter  Medications   amLODipine (NORVASC) 5 MG tablet    Sig: Take 1 tablet (5 mg total) by mouth daily.    Dispense:  90 tablet    Refill:  2   lisinopril (ZESTRIL) 10 MG tablet    Sig: Take 1 tablet (10 mg total) by mouth daily.    Dispense:  90 tablet    Refill:  2   Patient Instructions  Thanks for coming to see [place] today.  No medication changes for now.  I do recommend cutting back and ultimately quitting smoking.  Let me know if you do try to cut back and certainly if I can help you with smoking cessation.  Cholesterol was slightly elevated in the past.  I would recommend rechecking that level at some point, and if you do have an opportunity to have that checked through a health screening fair or other free screening labs, bring me a copy. Glad to hear you are doing well.  Take care!    Signed,   Meredith Staggers, MD [facility], [facility] 09/14/22 8:26 AM

## 2022-09-14 NOTE — Patient Instructions (Signed)
Thanks for coming to see Evan Bryant today.  No medication changes for now.  I do recommend cutting back and ultimately quitting smoking.  Let me know if you do try to cut back and certainly if I can help you with smoking cessation.  Cholesterol was slightly elevated in the past.  I would recommend rechecking that level at some point, and if you do have an opportunity to have that checked through a health screening fair or other free screening labs, bring me a copy. Glad to hear you are doing well.  Take care!

## 2022-11-13 ENCOUNTER — Telehealth: Payer: Self-pay | Admitting: Family Medicine

## 2022-11-13 NOTE — Telephone Encounter (Signed)
Patient Name First: Evan Last: Bryant Gender: Male DOB: [birth year] Age: 62 Y 1 M 26 D Return Phone Number: [phone] (Primary) Address: City/ State/ Zip: [place]  [address] Client [facility] [place] [facility] [place] Client Site [facility] Provider Meredith Staggers- MD Contact Type Call Who Is Calling Patient / Member / Family / Caregiver Call Type Triage / Clinical Relationship To Patient Self Return Phone Number [phone] (Primary) Chief Complaint HEAD INJURY - and not acting right. Change in behaviour after hitting head. Reason for Call Symptomatic / Request for Health Information Initial Comment Erie Noe with office transferred caller Patient fell and hit his head and dizzy and having hearing issues in the right ear. Translation No Nurse Assessment Nurse: Scarlette Ar, RN, Heather Date/Time (Eastern Time): 11/13/2022 1:39:37 PM Confirm and document reason for call. If symptomatic, describe symptoms. 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 He road it Monday night and he fell and hit his head that night. Does the patient have any new or worsening symptoms? ---Yes Will a triage be completed? ---Yes Related visit to physician within the last 2 weeks? ---No Does the PT have any chronic conditions? (i.e. diabetes, asthma, this includes High risk factors for pregnancy, etc.) ---Yes List chronic conditions. ---HTN Is this a behavioral health or substance abuse call? ---No Guidelines Guideline Title Affirmed Question Affirmed Notes Nurse Date/Time (Eastern Time) Dizziness - Vertigo SEVERE dizziness (vertigo) (e.g., unable to walk without assistance) Standifer, RN, Herbert Seta 11/13/2022 1:41:41 PM PLEASE NOTE: All timestamps contained within this report are represented as [place] Standard Time. CONFIDENTIALTY NOTICE: This fax transmission is intended only for the  addressee. It contains information that is legally privileged, confidential or otherwise protected from use or disclosure. If you are not the intended recipient, you are strictly prohibited from reviewing, disclosing, copying using or disseminating any of this information or taking any action in reliance on or regarding this information. If you have received this fax in error, please notify us immediately by telephone so that we can arrange for its return to [place]. Phone: [phone], Toll-Free: [phone], Fax: [phone] Page: 2 of 2 Call Id: [id number] Disp. Time Lamount Cohen Time) Disposition Final User 11/13/2022 1:35:51 PM Send to Urgent Michela Pitcher, Lanette 11/13/2022 1:43:13 PM Go to ED Now (or PCP triage) Yes Standifer, RN, Heather Final Disposition 11/13/2022 1:43:13 PM Go to ED Now (or PCP triage) Yes Standifer, RN, Sibyl Parr Disagree/Comply Disagree Caller Understands Yes PreDisposition Call Doctor Care Advice Given Per Guideline GO TO ED NOW (OR PCP TRIAGE): Comments User: Tyrone Apple, RN Date/Time Lamount Cohen Time): 11/13/2022 1:43:42 PM Caller states that he will go to the ED tomorrow Referrals GO TO FACILITY REFUSED  Call patient tomorrow (7/24) morning and follow-up with symptoms.

## 2022-11-13 NOTE — Telephone Encounter (Signed)
Caller name: JOHAN CREVELING  On DPR?: Yes  Call back number: [phone] (mobile)  Provider they see: Shade Flood, MD  Reason for call:   Pt called stating he is post fall dizzy can't hear out of one ear - sent to Triage.

## 2022-11-14 ENCOUNTER — Emergency Department (HOSPITAL_BASED_OUTPATIENT_CLINIC_OR_DEPARTMENT_OTHER): Payer: Self-pay

## 2022-11-14 ENCOUNTER — Encounter (HOSPITAL_BASED_OUTPATIENT_CLINIC_OR_DEPARTMENT_OTHER): Payer: Self-pay

## 2022-11-14 ENCOUNTER — Other Ambulatory Visit: Payer: Self-pay

## 2022-11-14 ENCOUNTER — Emergency Department (HOSPITAL_BASED_OUTPATIENT_CLINIC_OR_DEPARTMENT_OTHER)
Admission: EM | Admit: 2022-11-14 | Discharge: 2022-11-14 | Disposition: A | Discharge status: Home / Self Care | Payer: Self-pay | Attending: Emergency Medicine | Admitting: Emergency Medicine

## 2022-11-14 DIAGNOSIS — F17210 Nicotine dependence, cigarettes, uncomplicated: Secondary | ICD-10-CM | POA: Insufficient documentation

## 2022-11-14 DIAGNOSIS — R42 Dizziness and giddiness: Secondary | ICD-10-CM

## 2022-11-14 DIAGNOSIS — S02109A Fracture of base of skull, unspecified side, initial encounter for closed fracture: Secondary | ICD-10-CM | POA: Insufficient documentation

## 2022-11-14 DIAGNOSIS — S065X0A Traumatic subdural hemorrhage without loss of consciousness, initial encounter: Secondary | ICD-10-CM | POA: Insufficient documentation

## 2022-11-14 DIAGNOSIS — E871 Hypo-osmolality and hyponatremia: Secondary | ICD-10-CM | POA: Insufficient documentation

## 2022-11-14 DIAGNOSIS — S065XAA Traumatic subdural hemorrhage with loss of consciousness status unknown, initial encounter: Secondary | ICD-10-CM

## 2022-11-14 DIAGNOSIS — Z79899 Other long term (current) drug therapy: Secondary | ICD-10-CM | POA: Insufficient documentation

## 2022-11-14 DIAGNOSIS — I10 Essential (primary) hypertension: Secondary | ICD-10-CM | POA: Insufficient documentation

## 2022-11-14 DIAGNOSIS — W228XXA Striking against or struck by other objects, initial encounter: Secondary | ICD-10-CM | POA: Insufficient documentation

## 2022-11-14 LAB — CBC WITH DIFFERENTIAL/PLATELET
Abs Immature Granulocytes: 0.04 10*3/uL (ref 0.00–0.07)
Basophils Absolute: 0.1 10*3/uL (ref 0.0–0.1)
Basophils Relative: 1 %
Eosinophils Absolute: 0.2 10*3/uL (ref 0.0–0.5)
Eosinophils Relative: 2 %
HCT: 51.9 % (ref 39.0–52.0)
Hemoglobin: 18.8 g/dL — ABNORMAL HIGH (ref 13.0–17.0)
Immature Granulocytes: 0 %
Lymphocytes Relative: 23 %
Lymphs Abs: 2.4 10*3/uL (ref 0.7–4.0)
MCH: 34.4 pg — ABNORMAL HIGH (ref 26.0–34.0)
MCHC: 36.2 g/dL — ABNORMAL HIGH (ref 30.0–36.0)
MCV: 95.1 fL (ref 80.0–100.0)
Monocytes Absolute: 0.8 10*3/uL (ref 0.1–1.0)
Monocytes Relative: 8 %
Neutro Abs: 6.7 10*3/uL (ref 1.7–7.7)
Neutrophils Relative %: 66 %
Platelets: 252 10*3/uL (ref 150–400)
RBC: 5.46 MIL/uL (ref 4.22–5.81)
RDW: 12.4 % (ref 11.5–15.5)
WBC: 10.1 10*3/uL (ref 4.0–10.5)
nRBC: 0 % (ref 0.0–0.2)

## 2022-11-14 LAB — COMPREHENSIVE METABOLIC PANEL
ALT: 44 U/L (ref 0–44)
AST: 51 U/L — ABNORMAL HIGH (ref 15–41)
Albumin: 4.8 g/dL (ref 3.5–5.0)
Alkaline Phosphatase: 71 U/L (ref 38–126)
Anion gap: 10 (ref 5–15)
BUN: 23 mg/dL (ref 8–23)
CO2: 28 mmol/L (ref 22–32)
Calcium: 10.4 mg/dL — ABNORMAL HIGH (ref 8.9–10.3)
Chloride: 91 mmol/L — ABNORMAL LOW (ref 98–111)
Creatinine, Ser: 1.07 mg/dL (ref 0.61–1.24)
GFR, Estimated: >60 mL/min (ref >60)
Glucose, Bld: 126 mg/dL — ABNORMAL HIGH (ref 70–99)
Potassium: 4.7 mmol/L (ref 3.5–5.1)
Sodium: 129 mmol/L — ABNORMAL LOW (ref 135–145)
Total Bilirubin: 0.7 mg/dL (ref 0.3–1.2)
Total Protein: 9.3 g/dL — ABNORMAL HIGH (ref 6.5–8.1)

## 2022-11-14 LAB — MAGNESIUM: Magnesium: 2.2 mg/dL (ref 1.7–2.4)

## 2022-11-14 MED ORDER — MECLIZINE HCL 25 MG PO TABS: 25.0000 mg | ORAL_TABLET | Freq: Two times a day (BID) | ORAL | 0 refills | Status: DC | PRN

## 2022-11-14 MED ORDER — AMOXICILLIN-POT CLAVULANATE 875-125 MG PO TABS: 1.0000 | ORAL_TABLET | Freq: Two times a day (BID) | ORAL | 0 refills | Status: DC

## 2022-11-14 MED ORDER — SODIUM CHLORIDE 1 G PO TABS: 1.0000 g | ORAL_TABLET | Freq: Two times a day (BID) | ORAL | 0 refills | Status: AC

## 2022-11-14 MED ORDER — MECLIZINE HCL 25 MG PO TABS
25.0000 mg | ORAL_TABLET | Freq: Once | ORAL | Status: AC
  Administered 2022-11-14: 25 mg via ORAL
  Filled 2022-11-14: qty 1

## 2022-11-14 MED ORDER — SODIUM CHLORIDE 0.9 % IV BOLUS
1000.0000 mL | Freq: Once | INTRAVENOUS | Status: AC
  Administered 2022-11-14: 1000 mL via INTRAVENOUS

## 2022-11-14 NOTE — ED Provider Notes (Signed)
EMERGENCY DEPARTMENT AT [facility] Provider Note  CSN: [id number] Arrival date & time: 11/14/22 1501  Chief Complaint(s) Dizziness and loss of hearing (R ear)  HPI Evan Bryant is a 62 y.o. male with past medical history as below, significant for chronic Eto habuse, HCV, tobacco use, HTN who presents to the ED with complaint of dizzy/ hearing change. Onset around one week ago while at the beach. Reported was drinking more than typical (usually 1pint x3 nights weekly) he went on a wooden roller coaster and after getting off felt very dizzy, spinning sensation, felt like he was very drunk. Went back to hotel and continued to feel dizzy and fell down, hit his head, and was believed to have had LOC for a few moments. Vomited once that night as well. Since then has had persistent dizziness worse with ambulation or trying to change from seated to standing position. No numbness/weakness to extremities, no vision changes, no further falls. Has muffled hearing to his right ear over the past week as well, no tinnitus. Some pain/pressure to right ear as well. No fevers or chills, no cp or dib, no abd pain, no ongoing n/v, no change to bowel/bladder function.   Past Medical History Past Medical History:  Diagnosis Date   Hypertension    Patient Active Problem List   Diagnosis Date Noted   Chronic hepatitis C without hepatic coma (HCC) 10/10/2021   Hypertension 10/14/2019   Family history of early CAD 10/14/2019   Tobacco abuse disorder 10/14/2019   Home Medication(s) Prior to Admission medications   Medication Sig Start Date End Date Taking? Authorizing Provider  amoxicillin-clavulanate (AUGMENTIN) 875-125 MG tablet Take 1 tablet by mouth every 12 (twelve) hours. 11/14/22  Yes Sloan Leiter, DO  meclizine (ANTIVERT) 25 MG tablet Take 1 tablet (25 mg total) by mouth 2 (two) times daily as needed for dizziness. 11/14/22  Yes Tanda Rockers A, DO  sodium chloride 1 g tablet Take 1  tablet (1 g total) by mouth 2 (two) times daily with a meal for 3 days. 11/14/22 11/17/22 Yes Tanda Rockers A, DO  amLODipine (NORVASC) 5 MG tablet Take 1 tablet (5 mg total) by mouth daily. 09/14/22   Shade Flood, MD  lisinopril (ZESTRIL) 10 MG tablet Take 1 tablet (10 mg total) by mouth daily. 09/14/22   Shade Flood, MD                                                                                                                                    Past Surgical History Past Surgical History:  Procedure Laterality Date   APPENDECTOMY     30s   Family History Family History  Problem Relation Age of Onset   Arthritis Mother    Hearing loss Mother    Heart disease Mother    Hyperlipidemia Mother    Hypertension Mother    Kidney disease Mother  Miscarriages / Stillbirths Mother    Stroke Mother    Heart attack Father    Heart disease Father    Hypertension Father    Stroke Father    Asthma Sister    Early death Sister    Heart disease Sister    Hypertension Sister    Alcohol abuse Brother    Asthma Brother    Depression Brother    Drug abuse Brother    Heart attack Brother    Hypertension Brother    Mental illness Brother    Stroke Brother    Early death Son    Cancer Sister        Breast Cancer   Heart attack Sister    Heart disease Sister    Hypertension Sister     Social History Social History   Tobacco Use   Smoking status: Every Day    Current packs/day: 1.00    Types: Cigarettes, Cigars   Smokeless tobacco: Never   Tobacco comments:    Reports he's been smoking since age 58  Vaping Use   Vaping status: Every Day  Substance Use Topics   Alcohol use: Yes    Alcohol/week: 15.0 standard drinks of alcohol    Types: 15 Shots of liquor per week   Drug use: Never   Allergies Patient has no known allergies.  Review of Systems Review of Systems  HENT:  Positive for ear pain and hearing loss. Negative for trouble swallowing.   Eyes:  Negative for  photophobia and visual disturbance.  Respiratory:  Negative for shortness of breath.   Cardiovascular:  Negative for chest pain.  Gastrointestinal:  Positive for vomiting.  Neurological:  Positive for dizziness, syncope and headaches.  All other systems reviewed and are negative.   Physical Exam Vital Signs  I have reviewed the triage vital signs BP 127/84   Pulse 61   Temp 98.6 F (37 C)   Resp 12   SpO2 100%  Physical Exam Vitals and nursing note reviewed.  Constitutional:      General: He is not in acute distress.    Appearance: He is well-developed. He is not ill-appearing.     Comments: frail  HENT:     Head: Normocephalic and atraumatic. No raccoon eyes, Battle's sign, right periorbital erythema or left periorbital erythema.     Jaw: There is normal jaw occlusion. No trismus.     Right Ear: External ear normal. A middle ear effusion is present. No mastoid tenderness. Tympanic membrane is injected.     Left Ear: Tympanic membrane and external ear normal. No mastoid tenderness. Tympanic membrane is not injected.     Mouth/Throat:     Mouth: Mucous membranes are moist.  Eyes:     General: No scleral icterus.    Extraocular Movements: Extraocular movements intact.     Pupils: Pupils are equal, round, and reactive to light.  Cardiovascular:     Rate and Rhythm: Normal rate and regular rhythm.     Pulses: Normal pulses.     Heart sounds: Normal heart sounds.  Pulmonary:     Effort: Pulmonary effort is normal. No respiratory distress.     Breath sounds: Normal breath sounds.  Abdominal:     General: Abdomen is flat.     Palpations: Abdomen is soft.     Tenderness: There is no abdominal tenderness.  Musculoskeletal:     Cervical back: No rigidity.     Right lower leg: No edema.  Left lower leg: No edema.  Skin:    General: Skin is warm and dry.     Capillary Refill: Capillary refill takes less than 2 seconds.  Neurological:     Mental Status: He is alert and  oriented to person, place, and time.     GCS: GCS eye subscore is 4. GCS verbal subscore is 5. GCS motor subscore is 6.     Cranial Nerves: Cranial nerves 2-12 are intact. No dysarthria or facial asymmetry.     Sensory: Sensation is intact. No sensory deficit.     Motor: Motor function is intact. No tremor or pronator drift.     Coordination: Coordination is intact. Finger-Nose-Finger Test normal.     Comments: Gait testing deferred 2/2 pt safety Strength 5/5 BLUE BLLE   Psychiatric:        Mood and Affect: Mood normal.        Behavior: Behavior normal.     ED Results and Treatments Labs (all labs ordered are listed, but only abnormal results are displayed) Labs Reviewed  CBC WITH DIFFERENTIAL/PLATELET - Abnormal; Notable for the following components:      Result Value   Hemoglobin 18.8 (*)    MCH 34.4 (*)    MCHC 36.2 (*)    All other components within normal limits  COMPREHENSIVE METABOLIC PANEL - Abnormal; Notable for the following components:   Sodium 129 (*)    Chloride 91 (*)    Glucose, Bld 126 (*)    Calcium 10.4 (*)    Total Protein 9.3 (*)    AST 51 (*)    All other components within normal limits  MAGNESIUM                                                                                                                          Radiology CT TEMPORAL BONES WO CONTRAST  Result Date: 11/14/2022 CLINICAL DATA:  Skull fracture.  Hearing loss on the right. EXAM: CT TEMPORAL BONES WITHOUT CONTRAST TECHNIQUE: Axial and coronal plane CT imaging of the petrous temporal bones was performed with thin-collimation image reconstruction. No intravenous contrast was administered. Multiplanar CT image reconstructions were also generated. RADIATION DOSE REDUCTION: This exam was performed according to the departmental dose-optimization program which includes automated exposure control, adjustment of the mA and/or kV according to patient size and/or use of iterative reconstruction  technique. COMPARISON:  Head CT same day FINDINGS: RIGHT TEMPORAL BONE External auditory canal: Normal.  Tympanic membrane is normal. Middle ear cavity: No fluid in the middle ear. Ossicular chain is normal. The attic is clear. Inner ear structures: Inner ear structures are normal. No evidence of any temporal bone fracture/otic capsule injury. Internal auditory and facial nerve canals: Normal. Unaffected by the skull base fracture. Mastoid air cells: Clear. LEFT TEMPORAL BONE External auditory canal: Normal.  Tympanic membrane is normal. Middle ear cavity: Ossicular chain is normal. No fluid in the middle ear or attic. Inner ear structures: Normal Internal  auditory and facial nerve canals:  Normal Mastoid air cells: Clear except for a few opacified air cells at the mastoid tip, not significant. Vascular: Normal Limited intracranial: As previously shown, there is a small amount of subdural blood along the inferior surface of the anterior cranial fossa on the left. Visible orbits/paranasal sinuses: Normal Soft tissues: Otherwise normal IMPRESSION: 1. No evidence of temporal bone fracture/otic capsule injury. No fluid in the middle ear or mastoid air cells. 2. Small amount of subdural blood along the inferior surface of the anterior cranial fossa on the left, as previously shown by head CT. 3. Skull base and occipital bone fracture on the right as previously shown. Electronically Signed   By: Paulina Fusi M.D.   On: 11/14/2022 18:24   CT Head Wo Contrast  Result Date: 11/14/2022 CLINICAL DATA:  Poly trauma, blunt. Dizziness. Loss of hearing in the right ear. Passed out and fell 1 week ago. EXAM: CT HEAD WITHOUT CONTRAST TECHNIQUE: Contiguous axial images were obtained from the base of the skull through the vertex without intravenous contrast. RADIATION DOSE REDUCTION: This exam was performed according to the departmental dose-optimization program which includes automated exposure control, adjustment of the mA  and/or kV according to patient size and/or use of iterative reconstruction technique. COMPARISON:  None Available. FINDINGS: Brain: Normal appearance of the brainstem. 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 Cerebral hemispheres do not show evidence of an old small or large vessel stroke. There is a small subdural hematoma along the floor and medial wall of the anterior cranial fossa on the left. No mass effect or shift. No sign of intraparenchymal hemorrhage. No hydrocephalus. Vascular: No abnormal vascular finding. Skull: Linear nondisplaced fracture of the occipital skull base on the right. Occipital calvarium on the right. No other skull fracture. No evidence that the fracture actually involves the right temporal bone. One could question a subtle transverse fracture in the region of the otic capsule but I do not think this is definite. Consider high-resolution temporal bone CT. Sinuses/Orbits: Clear/normal Other: No fluid in the middle ears or mastoid air cells. IMPRESSION: 1. Linear nondisplaced fracture of the occipital skull base on the right. No evidence that the fracture actually involves the right temporal bone. Fracture extends back into the right occipital calvarium. As there is a description of hearing loss on the right following the fall, consider a temporal bone CT for high-resolution evaluation of the right temporal bone. 2. Small subdural hematoma along the floor and medial wall of the anterior cranial fossa on the left. No mass effect or shift. 3. 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 4. Critical Value/emergent results were called by telephone at the time of interpretation on 11/14/2022 at 4:56 pm to provider Tanda Rockers , who verbally acknowledged these results. Electronically Signed   By: Paulina Fusi M.D.   On: 11/14/2022 17:05    Pertinent labs & imaging results that were available during my care of the patient were reviewed by me and considered in my medical  decision making (see MDM for details).  Medications Ordered in ED Medications  sodium chloride 0.9 % bolus 1,000 mL (0 mLs Intravenous Stopped 11/14/22 1820)  meclizine (ANTIVERT) tablet 25 mg (25 mg Oral Given 11/14/22 1608)  Procedures Procedures  (including critical care time)  Medical Decision Making / ED Course    Medical Decision Making:    JEDD SCHULENBURG is a 62 y.o. male with past medical history as below, significant for chronic Etoh abuse, HCV, tobacco use, HTN who presents to the ED with complaint of dizzy/ hearing change. . The complaint involves an extensive differential diagnosis and also carries with it a high risk of complications and morbidity.  Serious etiology was considered. Ddx includes but is not limited to: vertigo peripheral vs central, meniere disease, dysequilibrium, AOM, etoh abuse, etc  Complete initial physical exam performed, notably the patient  was NAD, neuro non-focal, he is asymptomatic at rest but when tries to stand up dizzy / spinning feeling returns.    Reviewed and confirmed nursing documentation for past medical history, family history, social history.  Vital signs reviewed.    Clinical Course as of 11/14/22 2003  Wed Nov 14, 2022  1633 Hemoglobin(!): 18.8 Favor hemoconcentration, continue IVF [SG]  1721 Spoke with radiology, will order CT temporal to better evaluate right temporal bones given hearing loss and skull fx. Small subdural and fx along skull base per radiology [SG]  1915 CT temporal w/o temporal bone fx [SG]  1915 Pt is around 1wk from fall which is favored to be cause of SDH, will d/w NSGY but given time from injury likely no benefit from rpt imaging in next 24 hours [SG]    Clinical Course User Index [SG] Sloan Leiter, DO   He is feeling much better after Antivert and fluids. Dizziness has nearly  entirely resolved. CT imaging with SDH and fracture.  No further workup per neurosurgery given injury was around a week ago Start antibiotics for AOM, meclizine for presumed vertigo, follow-up with ENT Tobacco and alcohol cessation encouraged Sodium reduced, frequent alcohol use, will start on salt tablet, advised to follow-up with PCP for repeat labs in the next few days. He is tolerating p.o. without difficulty, no ongoing nausea or vomiting  Overall feeling much better, stable for discharge, neuroexam is nonfocal  The patient improved significantly and was discharged in stable condition. Detailed discussions were had with the patient regarding current findings, and need for close f/u with PCP or on call doctor. The patient has been instructed to return immediately if the symptoms worsen in any way for re-evaluation. Patient verbalized understanding and is in agreement with current care plan. All questions answered prior to discharge.         Additional history obtained: -Additional history obtained from friend -External records from outside source obtained and reviewed including: Chart review including previous notes, labs, imaging, consultation notes including primary care documentation, prior labs/imaging , home meds    Lab Tests: -I ordered, reviewed, and interpreted labs.   The pertinent results include:   Labs Reviewed  CBC WITH DIFFERENTIAL/PLATELET - Abnormal; Notable for the following components:      Result Value   Hemoglobin 18.8 (*)    MCH 34.4 (*)    MCHC 36.2 (*)    All other components within normal limits  COMPREHENSIVE METABOLIC PANEL - Abnormal; Notable for the following components:   Sodium 129 (*)    Chloride 91 (*)    Glucose, Bld 126 (*)    Calcium 10.4 (*)    Total Protein 9.3 (*)    AST 51 (*)    All other components within normal limits  MAGNESIUM    Notable for hypoNa - chronic etoh use  EKG   EKG Interpretation Date/Time:  Wednesday November 14 2022 16:04:27 EDT Ventricular Rate:  63 PR Interval:  128 QRS Duration:  99 QT Interval:  416 QTC Calculation: 426 R Axis:   86  Text Interpretation: Sinus rhythm Right atrial enlargement Borderline right axis deviation Interpretation limited secondary to artifact no prior Confirmed by Tanda Rockers ([id number]) on 11/14/2022 4:27:39 PM         Imaging Studies ordered: I ordered imaging studies including CTH CT temporal I independently visualized the following imaging with scope of interpretation limited to determining acute life threatening conditions related to emergency care; findings noted above, significant for SDH, skull fx I independently visualized and interpreted imaging. I agree with the radiologist interpretation   Medicines ordered and prescription drug management: Meds ordered this encounter  Medications   sodium chloride 0.9 % bolus 1,000 mL   meclizine (ANTIVERT) tablet 25 mg   amoxicillin-clavulanate (AUGMENTIN) 875-125 MG tablet    Sig: Take 1 tablet by mouth every 12 (twelve) hours.    Dispense:  14 tablet    Refill:  0   meclizine (ANTIVERT) 25 MG tablet    Sig: Take 1 tablet (25 mg total) by mouth 2 (two) times daily as needed for dizziness.    Dispense:  15 tablet    Refill:  0   sodium chloride 1 g tablet    Sig: Take 1 tablet (1 g total) by mouth 2 (two) times daily with a meal for 3 days.    Dispense:  6 tablet    Refill:  0    -I have reviewed the patients home medicines and have made adjustments as needed   Consultations Obtained: I requested consultation with the NSGY [facility],  and discussed lab and imaging findings as well as pertinent plan - they recommend: no further w/u from nsgy standpoint, can f/u in PCP office   Cardiac Monitoring: The patient was maintained on a cardiac monitor.  I personally viewed and interpreted the cardiac monitored which showed an underlying rhythm of: NSR  Social Determinants of Health:  Diagnosis or treatment  significantly limited by social determinants of health: current smoker and alcohol use Counseled patient for approximately 2 minutes regarding smoking cessation. Discussed risks of smoking and how they applied and affected their visit here today. Patient not ready to quit at this time, however will follow up with their primary doctor when they are.   CPT code: 14782: intermediate counseling for smoking cessation     Reevaluation: After the interventions noted above, I reevaluated the patient and found that they have improved  Co morbidities that complicate the patient evaluation  Past Medical History:  Diagnosis Date   Hypertension       Dispostion: Disposition decision including need for hospitalization was considered, and patient discharged from emergency department.    Final Clinical Impression(s) / ED Diagnoses Final diagnoses:  Subdural hematoma (HCC)  Hyponatremia  Closed fracture of base of skull, unspecified laterality, initial encounter (HCC)  Vertigo     This chart was dictated using voice recognition software.  Despite best efforts to proofread,  errors can occur which can change the documentation meaning.    Sloan Leiter, DO 11/14/22 2003

## 2022-11-14 NOTE — Telephone Encounter (Signed)
Called to check in on patient, no answer, LM asking he return the call

## 2022-11-14 NOTE — ED Triage Notes (Signed)
Pt c/o dizziness, loss of hearing in R ear, L ear "comes & goes." Visitor reports last Wednesday at the beach, pt "was drinking & passed out, hit his head, & was snoring." States he was not evaluated at time of incident. Pt reports N/V "at first, but not now." Denies blurred vision, unilateral deficit. Reports 1 pint/wk drinking hx.  Pt ambulatory to triage, NAD noted. GCS 15

## 2022-11-14 NOTE — Discharge Instructions (Addendum)
Please follow-up with your PCP for repeat labs in next week, your sodium was low  Please consider reduction or abstinence from alcohol  Also consider tobacco cessation  It was a pleasure caring for you today in the emergency department.  Please return to the emergency department for any worsening or worrisome symptoms.

## 2022-11-15 ENCOUNTER — Telehealth: Payer: Self-pay

## 2022-11-15 NOTE — Telephone Encounter (Signed)
Pt did go to ED Tuesday and did schedule Hospital f/u

## 2022-11-15 NOTE — Transitions of Care (Post Inpatient/ED Visit) (Signed)
   11/15/2022  Name: KENDRA WOOLFORD MRN: [id number] DOB: 01-02-1961  Today's TOC FU Call Status: Today's TOC FU Call Status:: Unsuccessul Call (1st Attempt) Unsuccessful Call (1st Attempt) Date: 11/15/22  Attempted to reach the patient regarding the most recent Inpatient/ED visit.  Follow Up Plan: Additional outreach attempts will be made to reach the patient to complete the Transitions of Care (Post Inpatient/ED visit) call.   Signature Lenard Simmer, CMA

## 2022-11-21 ENCOUNTER — Inpatient Hospital Stay: Payer: Self-pay | Admitting: Family Medicine

## 2022-11-21 MED ORDER — MECLIZINE HCL 25 MG PO TABS: 25.0000 mg | ORAL_TABLET | Freq: Two times a day (BID) | ORAL | 0 refills | Status: DC | PRN

## 2022-11-21 NOTE — Telephone Encounter (Signed)
Okay to fill? Was given 15 tabs on discharge

## 2022-11-21 NOTE — Telephone Encounter (Signed)
Caller name: Yates Decamp  On DPR?: Yes  Call back number:   Provider they see: Shade Flood, MD  Reason for call: Pt came in late for HFU today. But spouse is asking for Meclizine 25mg  tab refill until he returns for Aug 14th appt because he still has some dizziness. - Advise

## 2022-11-21 NOTE — Telephone Encounter (Signed)
Temporarily refilled

## 2022-12-05 ENCOUNTER — Ambulatory Visit (INDEPENDENT_AMBULATORY_CARE_PROVIDER_SITE_OTHER): Payer: Self-pay | Admitting: Family Medicine

## 2022-12-05 ENCOUNTER — Encounter: Payer: Self-pay | Admitting: Family Medicine

## 2022-12-05 ENCOUNTER — Encounter: Payer: Self-pay | Admitting: Neurology

## 2022-12-05 VITALS — BP 130/80 | HR 71 | Temp 98.4°F | Ht 72.0 in | Wt 126.8 lb

## 2022-12-05 DIAGNOSIS — H9191 Unspecified hearing loss, right ear: Secondary | ICD-10-CM

## 2022-12-05 DIAGNOSIS — I639 Cerebral infarction, unspecified: Secondary | ICD-10-CM

## 2022-12-05 DIAGNOSIS — E871 Hypo-osmolality and hyponatremia: Secondary | ICD-10-CM

## 2022-12-05 DIAGNOSIS — S065XAA Traumatic subdural hemorrhage with loss of consciousness status unknown, initial encounter: Secondary | ICD-10-CM

## 2022-12-05 DIAGNOSIS — S02119A Unspecified fracture of occiput, initial encounter for closed fracture: Secondary | ICD-10-CM

## 2022-12-05 DIAGNOSIS — R42 Dizziness and giddiness: Secondary | ICD-10-CM

## 2022-12-05 DIAGNOSIS — S069X9A Unspecified intracranial injury with loss of consciousness of unspecified duration, initial encounter: Secondary | ICD-10-CM

## 2022-12-05 LAB — CBC
HCT: 51.5 % (ref 39.0–52.0)
Hemoglobin: 17.7 g/dL — ABNORMAL HIGH (ref 13.0–17.0)
MCHC: 34.4 g/dL (ref 30.0–36.0)
MCV: 98.6 fl (ref 78.0–100.0)
Platelets: 219 10*3/uL (ref 150.0–400.0)
RBC: 5.22 Mil/uL (ref 4.22–5.81)
RDW: 13.1 % (ref 11.5–15.5)
WBC: 10 10*3/uL (ref 4.0–10.5)

## 2022-12-05 LAB — BASIC METABOLIC PANEL
BUN: 18 mg/dL (ref 6–23)
CO2: 28 mEq/L (ref 19–32)
Calcium: 10.3 mg/dL (ref 8.4–10.5)
Chloride: 97 mEq/L (ref 96–112)
Creatinine, Ser: 1.02 mg/dL (ref 0.40–1.50)
GFR: 78.93 mL/min (ref >60.00)
Glucose, Bld: 89 mg/dL (ref 70–99)
Potassium: 4.5 mEq/L (ref 3.5–5.1)
Sodium: 134 mEq/L — ABNORMAL LOW (ref 135–145)

## 2022-12-05 NOTE — Progress Notes (Signed)
Subjective:  Patient ID: Evan Bryant, male    DOB: 1960-12-21  Age: 62 y.o. MRN: [id number]  CC:  Chief Complaint  Patient presents with   Hospitalization Follow-up    Pt notes still having issues with being dizzy and has not regained hearing in the Rt ear     HPI Evan Bryant presents for   Hospital/ER follow-up.  TOC call attempted but unsuccessful on 11/15/2022  Skull fracture, subdural hematoma ER visit on 11/14/2022 reviewed.  Presented with dizziness and loss of hearing in his right ear.  Reported symptoms started approximately 1 week prior while at the beach.11/05/22  Had been drinking more alcohol than usual, and went on a roller coaster, noted dizzy, spinning sensation after coming off the roller coaster, continued to feel dizzy back of his hotel, fell down, hit his head and possible LOC for a few moments. No seizure activity.  Vomited once that night, then persistent dizziness with ambulation or movement from seated to standing position.  Muffled hearing of the right ear for the previous week without tinnitus. CT imaging - no temporal bone fracture but did have a linear nondisplaced fracture of the occipital skull base on the right.  Small subdural hematoma along the floor and medial wall of the anterior cranial fossa on the left without mass effect or shift.  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  Labs were significant for hyponatremia of sodium 129, glucose 126, calcium 10.4, AST elevation of 51, hemoglobin 18.8.  Was noted to have a middle ear effusion on the right.  Symptoms improved in ER with treatment with IV fluid and Antivert.  Above imaging discussed with neurosurgery, no further workup indicated given injury was around 1 week prior.  He was started on antibiotics for acute otitis media, meclizine for vertigo and ENT follow-up recommended.  Started on salt tablets for his hyponatremia and alcohol sensation as well as tobacco cessation was encouraged.  Repeat  labs recommended within a few days.  Since ER visit: Has not seen ENT.  No change in dizziness with meclizine, sedated with 2, no change in dizziness. Dizziness is the same. No vomiting. No headache. No new weakness or focal weakness.  Still unable to hear in R ear. Completed augmentin - no change. No pain in ear.  No vision changes.  No dizziness prior to fall and hitting head.   Alcohol use/abuse. Usually 1/5 liquor per week. No beer. Has cut back since incident - now 1 shot/drink every other day. No withdrawal sx's in past or recently.    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  Was given 3 days off from ER.   History Patient Active Problem List   Diagnosis Date Noted   Chronic hepatitis C without hepatic coma (HCC) 10/10/2021   Hypertension 10/14/2019   Family history of early CAD 10/14/2019   Tobacco abuse disorder 10/14/2019   Past Medical History:  Diagnosis Date   Hypertension    Past Surgical History:  Procedure Laterality Date   APPENDECTOMY     30s   No Known Allergies Prior to Admission medications   Medication Sig Start Date End Date Taking? Authorizing Provider  amLODipine (NORVASC) 5 MG tablet Take 1 tablet (5 mg total) by mouth daily. 09/14/22  Yes Shade Flood, MD  amoxicillin-clavulanate (AUGMENTIN) 875-125 MG tablet Take 1 tablet by mouth every 12 (twelve) hours. 11/14/22  Yes Tanda Rockers A, DO  lisinopril (ZESTRIL) 10  MG tablet Take 1 tablet (10 mg total) by mouth daily. 09/14/22  Yes Shade Flood, MD  meclizine (ANTIVERT) 25 MG tablet Take 1 tablet (25 mg total) by mouth 2 (two) times daily as needed for dizziness. 11/21/22  Yes Shade Flood, MD   Social History   Socioeconomic History   Marital status: Married    Spouse name: Not on file   Number of children: Not on file   Years of education: Not on file   Highest education level: Not on file  Occupational History   Not on file  Tobacco Use    Smoking status: Every Day    Current packs/day: 1.00    Types: Cigarettes, Cigars   Smokeless tobacco: Never   Tobacco comments:    Reports he's been smoking since age 32  Vaping Use   Vaping status: Every Day  Substance and Sexual Activity   Alcohol use: Yes    Alcohol/week: 15.0 standard drinks of alcohol    Types: 15 Shots of liquor per week   Drug use: Never   Sexual activity: Yes    Birth control/protection: None    Comment: declined condoms  Other Topics Concern   Not on file  Social History Narrative   Not on file   Social Determinants of Health   Financial Resource Strain: Not on file  Food Insecurity: Not on file  Transportation Needs: Not on file  Physical Activity: Not on file  Stress: Not on file  Social Connections: Not on file  Intimate Partner Violence: Not on file    Review of Systems Per HPI.   Objective:   Vitals:   12/05/22 1119  BP: 130/80  Pulse: 71  Temp: 98.4 F (36.9 C)  TempSrc: Temporal  SpO2: 98%  Weight: 126 lb 12.8 oz (57.5 kg)  Height: 6' (1.829 m)     Physical Exam Vitals reviewed.  Constitutional:      Appearance: He is well-developed.  HENT:     Head: Normocephalic and atraumatic.     Comments: Scalp nontender without any skin changes, ecchymosis, or apparent defect/step-off/depression.  Negative raccoon eyes, no hemotympanum, negative Battle sign.    Right Ear: Ear canal and external ear normal. There is no impacted cerumen.     Left Ear: Tympanic membrane, ear canal and external ear normal. There is no impacted cerumen.     Ears:     Comments: Small amount of clear fluid at base of TM, no erythema, retraction, injection, or discharge.  TM intact.  No hemotympanum Neck:     Vascular: No carotid bruit or JVD.  Cardiovascular:     Rate and Rhythm: Normal rate and regular rhythm.     Heart sounds: Normal heart sounds. No murmur heard. Pulmonary:     Effort: Pulmonary effort is normal.     Breath sounds: Normal breath  sounds. No rales.  Musculoskeletal:     Right lower leg: No edema.     Left lower leg: No edema.  Skin:    General: Skin is warm and dry.  Neurological:     General: No focal deficit present.     Mental Status: He is alert and oriented to person, place, and time.     GCS: GCS eye subscore is 4. GCS verbal subscore is 5. GCS motor subscore is 6.     Cranial Nerves: No dysarthria or facial asymmetry.     Motor: No weakness, tremor, seizure activity or pronator drift.  Coordination: Romberg sign positive (Unsteady initially but corrects on own). Finger-Nose-Finger Test and Heel to [place] Test normal.     Gait: Gait is intact.     Comments: Gait intact but cautious.  Ambulates without assistive device.  Psychiatric:        Mood and Affect: Mood normal.      Assessment & Plan:  SABATINO PEABODY is a 62 y.o. male . 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  Imaging as above over a week later with old small vessel cerebellar infarction.  Unlikely cause of acute dizziness.  Discussed with neurosurgery at that time and based on timing since injury no further workup recommended.  Denies any new headaches, or new focal neurologic symptoms.  Dizziness has persisted but not worsened.  Right sided hearing loss persisted but no pain, no change with Augmentin.    -With persistent dizziness and prior neuroimaging including old cerebellar infarction will refer to neurology to decide on further testing, treatment.  Fall precautions discussed including use of cane or assistive  device if needed and note provided for work.  -Possible serous otitis, effusion, but overall reassuring ear exam.  No change with Augmentin, unlikely infectious.  With persistent dizziness, vertigo in differential but no change with meclizine.  Refer to ENT due to persistent hearing loss and dizziness.  -Check BMP with prior hyponatremia, and CBC with elevated hemoglobin.  Commended on his decreased alcohol intake.  Depending on hyponatremia level, could also contribute to dizziness.  Adjustment of plan accordingly based on lab results.  2-week follow-up with ER precautions given in the interim  No orders of the defined types were placed in this encounter.  Patient Instructions  Sorry to hear about your fall and the persistent dizziness and ear issues.  I will refer you to the ear specialist, but I do not see any sign of infection in the ear today.  You could still have some fluid behind that middle ear as we discussed, but I would like the ear nose and throat specialist to see you and decide if other treatment or testing needed.  I am also referring you to neurology for the persistent dizziness after your recent head injury.  If any new headaches, or worsening symptoms, I do recommend emergency room evaluation for possible repeat imaging or testing.  On the previous CAT scan they did see a blockage of a small artery in the back part of the brain but that appeared to be old.  Again this can be discussed with neurology.  Continue to minimize alcohol intake, I will check your sodium level today as well as your blood counts as those were abnormal in the ER.  Depending on that sodium level that can also sometimes cause dizziness and I will let you know if there are any concerns on these labs.  See information below about dizziness and fall precautions at home.  You may need to use a cane for stability for now, and slow movements to minimize risk of falling. Hang in there.  Follow-up with me in the next 2  weeks if you have  not seen a specialist during that time.  As above if any new or worsening symptoms be seen in the emergency room.  Fall Prevention in the Home, Adult Falls can cause injuries and affect people of all ages. There are many simple things that you can do to make your home safe and to help prevent falls. If you need it, ask for help making these changes. What actions can I take to prevent falls? General information Use good lighting in all rooms. Make sure to: Replace any light bulbs that burn out. Turn on lights if it is dark and use night-lights. Keep items that you use often in easy-to-reach places. Lower the shelves around your home if needed. Move furniture so that there are clear paths around it. Do not keep throw rugs or other things on the floor that can make you trip. If any of your floors are uneven, fix them. Add color or contrast paint or tape to clearly mark and help you see: Grab bars or handrails. First and last steps of staircases. Where the edge of each step is. If you use a ladder or stepladder: Make sure that it is fully opened. Do not climb a closed ladder. Make sure the sides of the ladder are locked in place. Have someone hold the ladder while you use it. Know where your pets are as you move through your home. What can I do in the bathroom?     Keep the floor dry. Clean up any water that is on the floor right away. Remove soap buildup in the bathtub or shower. Buildup makes bathtubs and showers slippery. Use non-skid mats or decals on the floor of the bathtub or shower. Attach bath mats securely with double-sided, non-slip rug tape. If you need to sit down while you are in the shower, use a non-slip stool. Install grab bars by the toilet and in the bathtub and shower. Do not use towel bars as grab bars. What can I do in the bedroom? Make sure that you have a light by your bed that is easy to reach. Do not use any sheets or blankets on your bed  that hang to the floor. Have a firm bench or chair with side arms that you can use for support when you get dressed. What can I do in the kitchen? Clean up any spills right away. If you need to reach something above you, use a sturdy step stool that has a grab bar. Keep electrical cables out of the way. Do not use floor polish or wax that makes floors slippery. What can I do with my stairs? Do not leave anything on the stairs. Make sure that you have a light switch at the top and the bottom of the stairs. Have them installed if you do not have them. Make sure that there are handrails on both sides of the stairs. Fix handrails that are broken or loose. Make sure that handrails are as long as the staircases. Install non-slip stair treads on all stairs in your home if they do not have carpet. Avoid having throw rugs at the top or bottom of stairs, or secure the rugs with carpet tape to prevent them from moving. Choose a carpet design that does not hide the edge of steps on the stairs. Make sure that carpet is firmly attached to the stairs. Fix any carpet that is loose or worn. What can I do on the outside of my home? Use bright outdoor lighting. Repair the edges  of walkways and driveways and fix any cracks. Clear paths of anything that can make you trip, such as tools or rocks. Add color or contrast paint or tape to clearly mark and help you see high doorway thresholds. Trim any bushes or trees on the main path into your home. Check that handrails are securely fastened and in good repair. Both sides of all steps should have handrails. Install guardrails along the edges of any raised decks or porches. Have leaves, snow, and ice cleared regularly. Use sand, salt, or ice melt on walkways during winter months if you live where there is ice and snow. In the garage, clean up any spills right away, including grease or oil spills. What other actions can I take? Review your medicines with your health  care provider. Some medicines can make you confused or feel dizzy. This can increase your chance of falling. Wear closed-toe shoes that fit well and support your feet. Wear shoes that have rubber soles and low heels. Use a cane, walker, scooter, or crutches that help you move around if needed. Talk with your provider about other ways that you can decrease your risk of falls. This may include seeing a physical therapist to learn to do exercises to improve movement and strength. Where to find more information Centers for Disease Control and Prevention, STEAD[employer]: TonerPromos.no [employer] on Aging: BaseRingTones.pl National Institute on Aging: BaseRingTones.pl Contact a health care provider if: You are afraid of falling at home. You feel weak, drowsy, or dizzy at home. You fall at home. Get help right away if you: Lose consciousness or have trouble moving after a fall. Have a fall that causes a head injury. These symptoms may be an emergency. Get help right away. Call 911. Do not wait to see if the symptoms will go away. Do not drive yourself to the hospital. This information is not intended to replace advice given to you by your health care provider. Make sure you discuss any questions you have with your health care provider. Document Revised: 12/11/2021 Document Reviewed: 12/11/2021 Elsevier Patient Education  2024 Elsevier Inc.   Dizziness Dizziness is a common problem. It makes you feel unsteady or light-headed. You may feel like you're about to faint. Dizziness can lead to getting hurt if you stumble or fall. It's more common to feel dizzy if you're an older adult. Many things can cause you to feel dizzy. These include: Medicines. Dehydration. This is when there's not enough water in your body. Illness. Follow these instructions at home: Eating and drinking  Drink enough fluid to keep your pee (urine) pale yellow. This helps keep you from getting dehydrated. Try to drink more clear fluids,  such as water. Do not drink alcohol. Try to limit how much caffeine you take in. Try to limit how much salt, also called sodium, you take in. Activity Try not to make quick movements. Stand up slowly from sitting in a chair. Steady yourself until you feel okay. In the morning, first sit up on the side of the bed. When you feel okay, hold onto something and slowly stand up. Do this until you know that your balance is okay. If you need to stand in one place for a long time, move your legs often. Tighten and relax the muscles in your legs while you're standing. Do not drive or use machines if you feel dizzy. Avoid bending down if you feel dizzy. Place items in your home so you can reach them without leaning over.  Lifestyle Do not smoke, vape, or use products with nicotine or tobacco in them. If you need help quitting, talk with your health care provider. Try to lower your stress level. You can do this by using methods like yoga or meditation. Talk with your provider if you need help. General instructions Watch your dizziness for any changes. Take your medicines only as told by your provider. Talk with your provider if you think you're dizzy because of a medicine you're taking. Tell a friend or a family member that you're feeling dizzy. If they spot any changes in your behavior, have them call your provider. Contact a health care provider if: Your dizziness doesn't go away, or you have new symptoms. Your dizziness gets worse. You feel like you may vomit. You have trouble hearing. You have a fever. You have neck pain or a stiff neck. You fall or get hurt. Get help right away if: You vomit each time you eat or drink. You have watery poop and can't eat or drink. You have trouble talking, walking, swallowing, or using your arms, hands, or legs. You feel very weak. You're bleeding. You're not thinking clearly, or you have trouble forming sentences. A friend or family member may spot  this. Your vision changes, or you get a very bad headache. These symptoms may be an emergency. Call 911 right away. Do not wait to see if the symptoms will go away. Do not drive yourself to the hospital. This information is not intended to replace advice given to you by your health care provider. Make sure you discuss any questions you have with your health care provider. Document Revised: 05/24/2022 Document Reviewed: 05/24/2022 Elsevier Patient Education  2024 Elsevier Inc.         Signed,   Meredith Staggers, MD [facility], [facility] 12/05/22 12:34 PM

## 2022-12-05 NOTE — Patient Instructions (Addendum)
Sorry to hear about your fall and the persistent dizziness and ear issues.  I will refer you to the ear specialist, but I do not see any sign of infection in the ear today.  You could still have some fluid behind that middle ear as we discussed, but I would like the ear nose and throat specialist to see you and decide if other treatment or testing needed.  I am also referring you to neurology for the persistent dizziness after your recent head injury.  If any new headaches, or worsening symptoms, I do recommend emergency room evaluation for possible repeat imaging or testing.  On the previous CAT scan they did see a blockage of a small artery in the back part of the brain but that appeared to be old.  Again this can be discussed with neurology.  Continue to minimize alcohol intake, I will check your sodium level today as well as your blood counts as those were abnormal in the ER.  Depending on that sodium level that can also sometimes cause dizziness and I will let you know if there are any concerns on these labs.  See information below about dizziness and fall precautions at home.  You may need to use a cane for stability for now, and slow movements to minimize risk of falling. Hang in there.  Follow-up with me in the next 2 weeks if you have not seen a specialist during that time.  As above if any new or worsening symptoms be seen in the emergency room.  Fall Prevention in the Home, Adult Falls can cause injuries and affect people of all ages. There are many simple things that you can do to make your home safe and to help prevent falls. If you need it, ask for help making these changes. What actions can I take to prevent falls? General information Use good lighting in all rooms. Make sure to: Replace any light bulbs that burn out. Turn on lights if it is dark and use night-lights. Keep items that you use often in easy-to-reach places. Lower the shelves around your home if needed. Move furniture  so that there are clear paths around it. Do not keep throw rugs or other things on the floor that can make you trip. If any of your floors are uneven, fix them. Add color or contrast paint or tape to clearly mark and help you see: Grab bars or handrails. First and last steps of staircases. Where the edge of each step is. If you use a ladder or stepladder: Make sure that it is fully opened. Do not climb a closed ladder. Make sure the sides of the ladder are locked in place. Have someone hold the ladder while you use it. Know where your pets are as you move through your home. What can I do in the bathroom?     Keep the floor dry. Clean up any water that is on the floor right away. Remove soap buildup in the bathtub or shower. Buildup makes bathtubs and showers slippery. Use non-skid mats or decals on the floor of the bathtub or shower. Attach bath mats securely with double-sided, non-slip rug tape. If you need to sit down while you are in the shower, use a non-slip stool. Install grab bars by the toilet and in the bathtub and shower. Do not use towel bars as grab bars. What can I do in the bedroom? Make sure that you have a light by your bed that is easy to reach.  Do not use any sheets or blankets on your bed that hang to the floor. Have a firm bench or chair with side arms that you can use for support when you get dressed. What can I do in the kitchen? Clean up any spills right away. If you need to reach something above you, use a sturdy step stool that has a grab bar. Keep electrical cables out of the way. Do not use floor polish or wax that makes floors slippery. What can I do with my stairs? Do not leave anything on the stairs. Make sure that you have a light switch at the top and the bottom of the stairs. Have them installed if you do not have them. Make sure that there are handrails on both sides of the stairs. Fix handrails that are broken or loose. Make sure that handrails are  as long as the staircases. Install non-slip stair treads on all stairs in your home if they do not have carpet. Avoid having throw rugs at the top or bottom of stairs, or secure the rugs with carpet tape to prevent them from moving. Choose a carpet design that does not hide the edge of steps on the stairs. Make sure that carpet is firmly attached to the stairs. Fix any carpet that is loose or worn. What can I do on the outside of my home? Use bright outdoor lighting. Repair the edges of walkways and driveways and fix any cracks. Clear paths of anything that can make you trip, such as tools or rocks. Add color or contrast paint or tape to clearly mark and help you see high doorway thresholds. Trim any bushes or trees on the main path into your home. Check that handrails are securely fastened and in good repair. Both sides of all steps should have handrails. Install guardrails along the edges of any raised decks or porches. Have leaves, snow, and ice cleared regularly. Use sand, salt, or ice melt on walkways during winter months if you live where there is ice and snow. In the garage, clean up any spills right away, including grease or oil spills. What other actions can I take? Review your medicines with your health care provider. Some medicines can make you confused or feel dizzy. This can increase your chance of falling. Wear closed-toe shoes that fit well and support your feet. Wear shoes that have rubber soles and low heels. Use a cane, walker, scooter, or crutches that help you move around if needed. Talk with your provider about other ways that you can decrease your risk of falls. This may include seeing a physical therapist to learn to do exercises to improve movement and strength. Where to find more information Centers for Disease Control and Prevention, [employer]: TonerPromos.no [employer] on Aging: BaseRingTones.pl National Institute on Aging: BaseRingTones.pl Contact a health care provider if: You  are afraid of falling at home. You feel weak, drowsy, or dizzy at home. You fall at home. Get help right away if you: Lose consciousness or have trouble moving after a fall. Have a fall that causes a head injury. These symptoms may be an emergency. Get help right away. Call 911. Do not wait to see if the symptoms will go away. Do not drive yourself to the hospital. This information is not intended to replace advice given to you by your health care provider. Make sure you discuss any questions you have with your health care provider. Document Revised: 12/11/2021 Document Reviewed: 12/11/2021 Elsevier Patient Education  2024  Elsevier Inc.   Dizziness Dizziness is a common problem. It makes you feel unsteady or light-headed. You may feel like you're about to faint. Dizziness can lead to getting hurt if you stumble or fall. It's more common to feel dizzy if you're an older adult. Many things can cause you to feel dizzy. These include: Medicines. Dehydration. This is when there's not enough water in your body. Illness. Follow these instructions at home: Eating and drinking  Drink enough fluid to keep your pee (urine) pale yellow. This helps keep you from getting dehydrated. Try to drink more clear fluids, such as water. Do not drink alcohol. Try to limit how much caffeine you take in. Try to limit how much salt, also called sodium, you take in. Activity Try not to make quick movements. Stand up slowly from sitting in a chair. Steady yourself until you feel okay. In the morning, first sit up on the side of the bed. When you feel okay, hold onto something and slowly stand up. Do this until you know that your balance is okay. If you need to stand in one place for a long time, move your legs often. Tighten and relax the muscles in your legs while you're standing. Do not drive or use machines if you feel dizzy. Avoid bending down if you feel dizzy. Place items in your home so you can reach  them without leaning over. Lifestyle Do not smoke, vape, or use products with nicotine or tobacco in them. If you need help quitting, talk with your health care provider. Try to lower your stress level. You can do this by using methods like yoga or meditation. Talk with your provider if you need help. General instructions Watch your dizziness for any changes. Take your medicines only as told by your provider. Talk with your provider if you think you're dizzy because of a medicine you're taking. Tell a friend or a family member that you're feeling dizzy. If they spot any changes in your behavior, have them call your provider. Contact a health care provider if: Your dizziness doesn't go away, or you have new symptoms. Your dizziness gets worse. You feel like you may vomit. You have trouble hearing. You have a fever. You have neck pain or a stiff neck. You fall or get hurt. Get help right away if: You vomit each time you eat or drink. You have watery poop and can't eat or drink. You have trouble talking, walking, swallowing, or using your arms, hands, or legs. You feel very weak. You're bleeding. You're not thinking clearly, or you have trouble forming sentences. A friend or family member may spot this. Your vision changes, or you get a very bad headache. These symptoms may be an emergency. Call 911 right away. Do not wait to see if the symptoms will go away. Do not drive yourself to the hospital. This information is not intended to replace advice given to you by your health care provider. Make sure you discuss any questions you have with your health care provider. Document Revised: 05/24/2022 Document Reviewed: 05/24/2022 Elsevier Patient Education  2024 [employer].

## 2022-12-14 NOTE — Progress Notes (Signed)
Initial neurology clinic note  Evan Bryant MRN: [id number] DOB: 09-26-1960  Referring provider: Shade Flood, MD  Primary care provider: Shade Flood, MD  Reason for consult:  Dizziness  Subjective:  This is Mr. Evan Bryant, a 62 y.o. right-handed male with a medical history of HTN, SDH (10/2022), current smoker, EtOH abuse who presents to neurology clinic with dizziness and right hearing loss. The patient is accompanied by girlfriend.  Patient was at the beach and endorses drinking EtOH on 11/05/22. He went on a wooden roller coaster and when he got off, he was stumbling all over the place. He was not like this prior to the roller coaster. He seemed more "drunk" at that point. Girlfriend is unsure how drunk he was and maybe this was why he was stumbling. He was helped to the hotel by his girlfriend. He got another drink and went to the balcony to smoke. As he was coming in, he tripped and hit his head on concrete floor. After he hit his head, he had extreme dizziness and not able to hear at all in the right ear. He describes the dizziness as when he stands or bends over he stumbles. If he is up for a while, symptoms improve some. There are no symptoms when he is sitting or driving or moving his head. He does not get dizzy when rolling over in bed. He feels like he has "water on the ear".  Patient presented to the ED on 11/14/22 with dizziness and hearing loss. CT head showed a nondisplaced fracture of the occipital skull base on right with small subdural hematoma along the floor and medial wall of the anterior cranial fossa. 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 Symptoms improved in ED with IVF and antivert. Neurosurgery indicated no further work up or surgical management was required. He was also started on antibiotics for acute otitis media, meclizine for vertigo, and referred to ENT. He had hyponatremia and was started on salt tabs.  Patient then followed up  with PCP on 12/05/22 and had no change in dizziness and meclizine has not helped. He is still unable to hear in right ear. This continues to be true. He feels symptoms are exactly the same since symptom onset after the fall. Patient has not seen ENT though he has been referred. He has not heard from anyone.  Patient currently has no headache. He denies diplopia, vision changes, difficulty swallowing, weakness or numbness or tingling.  EtOH use: typically a shot per day and maybe some beer; has had to stop over last week due to severe drunkenness if he drinks since fall Restriction diet: no   MEDICATIONS:  Outpatient Encounter Medications as of 12/26/2022  Medication Sig   amLODipine (NORVASC) 5 MG tablet Take 1 tablet (5 mg total) by mouth daily.   lisinopril (ZESTRIL) 10 MG tablet Take 1 tablet (10 mg total) by mouth daily.   amoxicillin-clavulanate (AUGMENTIN) 875-125 MG tablet Take 1 tablet by mouth every 12 (twelve) hours. (Patient not taking: Reported on 12/26/2022)   meclizine (ANTIVERT) 25 MG tablet Take 1 tablet (25 mg total) by mouth 2 (two) times daily as needed for dizziness. (Patient not taking: Reported on 12/26/2022)   No facility-administered encounter medications on file as of 12/26/2022.    PAST MEDICAL HISTORY: Past Medical History:  Diagnosis Date   Hypertension     PAST SURGICAL HISTORY: Past Surgical History:  Procedure Laterality Date   APPENDECTOMY  30s    ALLERGIES: No Known Allergies  FAMILY HISTORY: Family History  Problem Relation Age of Onset   Arthritis Mother    Hearing loss Mother    Heart disease Mother    Hyperlipidemia Mother    Hypertension Mother    Kidney disease Mother    Miscarriages / [place] Mother    Stroke Mother    Heart attack Father    Heart disease Father    Hypertension Father    Stroke Father    Asthma Sister    Early death Sister    Heart disease Sister    Hypertension Sister    Alcohol abuse Brother    Asthma  Brother    Depression Brother    Drug abuse Brother    Heart attack Brother    Hypertension Brother    Mental illness Brother    Stroke Brother    Early death Son    Cancer Sister        Breast Cancer   Heart attack Sister    Heart disease Sister    Hypertension Sister     SOCIAL HISTORY: Social History   Tobacco Use   Smoking status: Every Day    Current packs/day: 1.00    Types: Cigarettes, Cigars   Smokeless tobacco: Never   Tobacco comments:    Reports he's been smoking since age 89  Vaping Use   Vaping status: Every Day   Devices: just for fun  Substance Use Topics   Alcohol use: Yes    Alcohol/week: 15.0 standard drinks of alcohol    Types: 15 Shots of liquor per week    Comment: none in a week 12/26/22   Drug use: Never   Social History   Social History Narrative   Are you right handed or left handed? Right   What is your current occupation?   Do you live at home alone? [name] at [place]   Who lives with you? With girlfriend   What type of home do you live in: 1 story or 2 story? one    Caffeine a lot 10 cups a day    Objective:  Vital Signs:  BP (!) 149/90   Pulse 72   Ht 6' (1.829 m)   Wt 131 lb (59.4 kg)   SpO2 100%   BMI 17.77 kg/m   General: General appearance: Awake and alert. No distress. Cooperative with exam. Thin (?temporal wasting) Skin: No obvious rash or jaundice. HEENT: Atraumatic. Anicteric. Lungs: Non-labored breathing on room air  Extremities: No edema.  Psych: Affect appropriate.  Neurological: Mental Status: Alert. Speech fluent. No pseudobulbar affect Cranial Nerves:  CNII: No RAPD. Visual fields intact. CNIII, IV, VI: PERRL. No nystagmus. EOMI. CN V: Facial sensation intact bilaterally to fine touch. Masseter clench strong. CN VII: Facial muscles symmetric and strong. No ptosis at rest. CN VIII: Hears finger rub well bilaterally. CN IX: No hypophonia. CN X: Palate elevates symmetrically. CN XI: Full strength  shoulder shrug bilaterally. CN XII: Tongue protrusion full and midline. No atrophy or fasciculations. No significant dysarthria Rinne test: left normal; right bone > air Weber test: localizes to left ear Motor: Tone is normal. Strength is 5/5 in bilateral upper and lower extremities. Reflexes:  Right Left   Bicep 2-3+ 2-3+   Tricep 2-3+ 2-3+   BrRad 2-3+ 2-3+   Knee 3+ 3+ Cross adductors bilaterally  Ankle 2+ 2+    Pathological Reflexes: Babinski: flexor response bilaterally Hoffman: absent bilaterally Troemner: absent  bilaterally Sensation: Pinprick: Intact in all extremities Vibration: Intact in all extremities Proprioception: Intact in bilateral great toes Coordination: Intact finger-to- nose-finger bilaterally. Romberg negative. Gait: Able to rise from chair with arms crossed unassisted. Normal, narrow-based gait. Stumbles to right mostly.   Labs and Imaging review: Internal labs: 12/05/22: BMP: Na 134 CBC: Hb 17.7  11/14/22: Mg: 2.2 CMP: Na 129, glucose 126, AST 51, ALT wnl CBC: Hb 18.8  Imaging: CT head wo contrast (11/14/22): FINDINGS: Brain: Normal appearance of the brainstem. 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 Cerebral hemispheres do not show evidence of an old small or large vessel stroke. There is a small subdural hematoma along the floor and medial wall of the anterior cranial fossa on the left. No mass effect or shift. No sign of intraparenchymal hemorrhage. No hydrocephalus.   Vascular: No abnormal vascular finding.   Skull: Linear nondisplaced fracture of the occipital skull base on the right. Occipital calvarium on the right. No other skull fracture. No evidence that the fracture actually involves the right temporal bone. One could question a subtle transverse fracture in the region of the otic capsule but I do not think this is definite. Consider high-resolution temporal bone CT.   Sinuses/Orbits: Clear/normal   Other: No  fluid in the middle ears or mastoid air cells.   IMPRESSION: 1. Linear nondisplaced fracture of the occipital skull base on the right. No evidence that the fracture actually involves the right temporal bone. Fracture extends back into the right occipital calvarium. As there is a description of hearing loss on the right following the fall, consider a temporal bone CT for high-resolution evaluation of the right temporal bone. 2. Small subdural hematoma along the floor and medial wall of the anterior cranial fossa on the left. No mass effect or shift. 3. 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  CT temporal bones wo contrast (11/14/22): FINDINGS: RIGHT TEMPORAL BONE   External auditory canal: Normal.  Tympanic membrane is normal.   Middle ear cavity: No fluid in the middle ear. Ossicular chain is normal. The attic is clear.   Inner ear structures: Inner ear structures are normal. No evidence of any temporal bone fracture/otic capsule injury.   Internal auditory and facial nerve canals: Normal. Unaffected by the skull base fracture.   Mastoid air cells: Clear.   LEFT TEMPORAL BONE   External auditory canal: Normal.  Tympanic membrane is normal.   Middle ear cavity: Ossicular chain is normal. No fluid in the middle ear or attic.   Inner ear structures: Normal   Internal auditory and facial nerve canals:  Normal   Mastoid air cells: Clear except for a few opacified air cells at the mastoid tip, not significant.   Vascular: Normal   Limited intracranial: As previously shown, there is a small amount of subdural blood along the inferior surface of the anterior cranial fossa on the left.   Visible orbits/paranasal sinuses: Normal   Soft tissues: Otherwise normal   IMPRESSION: 1. No evidence of temporal bone fracture/otic capsule injury. No fluid in the middle ear or mastoid air cells. 2. Small amount of subdural blood along the inferior surface of  the anterior cranial fossa on the left, as previously shown by head CT. 3. Skull base and occipital bone fracture on the right as previously shown.   Assessment/Plan:  HERVEY ESCARENO is a 62 y.o. male who presents for evaluation of dizziness and right sided hearing loss. He has a relevant medical  history of HTN, right occipital skull fracture and SDH (10/2022), current smoker, EtOH abuse. His neurological examination is pertinent for hearing loss in right ear and gait imbalance. 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 Patient's symptoms appear to have started after his fall and skull fracture, definitely the right sided hearing loss. If his imbalance after the roller coaster was due to the coaster or EtOH use is not as clear to me, but is concerning. The fall may have cause a problem in the right ear or to the right CN VIII. He also had an old appearing right cerebellar stroke that could potentially cause imbalance. Dissection from roller coaster with subsequent posterior circulation stroke is also a possible cause of the dizziness that lead to his fall. I will work up further as below.  PLAN: -Blood work: B1, B12, folate, copper, vit E -MRI brain w/wo contrast -Discussed vestibular rehab, patient would rather see ENT first -See ENT - referral given by PCP  -Return to clinic in 2 months  The impression above as well as the plan as outlined below were extensively discussed with the patient (in the company of girlfriend) who voiced understanding. All questions were answered to their satisfaction.  The patient was counseled on pertinent fall precautions per the printed material provided today, and as noted under the "Patient Instructions" section below.  When available, results of the above investigations and possible further recommendations will be communicated to the patient via  telephone/MyChart. Patient to call office if not contacted after expected testing turnaround time.   Total time spent reviewing records, interview, history/exam, documentation, and coordination of care on day of encounter:  50 min   Thank you for allowing me to participate in patient's care.  If I can answer any additional questions, I would be pleased to do so.  Jacquelyne Balint, MD   CC: Shade Flood, MD [address] [place] [address]  CC: Referring provider: Shade Flood, MD [address] [place],  [place] [address]

## 2022-12-18 ENCOUNTER — Other Ambulatory Visit: Payer: Self-pay | Admitting: Family Medicine

## 2022-12-18 DIAGNOSIS — H9191 Unspecified hearing loss, right ear: Secondary | ICD-10-CM

## 2022-12-18 NOTE — Progress Notes (Signed)
See last visit, ENT requested audiology eval first.

## 2022-12-19 ENCOUNTER — Ambulatory Visit (INDEPENDENT_AMBULATORY_CARE_PROVIDER_SITE_OTHER): Payer: Self-pay | Admitting: Family Medicine

## 2022-12-19 VITALS — BP 138/84 | HR 67 | Temp 97.9°F | Ht 72.0 in | Wt 129.4 lb

## 2022-12-19 DIAGNOSIS — S02119A Unspecified fracture of occiput, initial encounter for closed fracture: Secondary | ICD-10-CM

## 2022-12-19 DIAGNOSIS — R29818 Other symptoms and signs involving the nervous system: Secondary | ICD-10-CM

## 2022-12-19 DIAGNOSIS — S065XAA Traumatic subdural hemorrhage with loss of consciousness status unknown, initial encounter: Secondary | ICD-10-CM

## 2022-12-19 DIAGNOSIS — H9191 Unspecified hearing loss, right ear: Secondary | ICD-10-CM

## 2022-12-19 DIAGNOSIS — I639 Cerebral infarction, unspecified: Secondary | ICD-10-CM

## 2022-12-19 DIAGNOSIS — S069X9A Unspecified intracranial injury with loss of consciousness of unspecified duration, initial encounter: Secondary | ICD-10-CM

## 2022-12-19 NOTE — Progress Notes (Signed)
Subjective:  Patient ID: Evan Bryant, male    DOB: [birth year]  Age: 62 y.o. MRN: [id number]  CC:  Chief Complaint  Patient presents with   Dizziness    Notes it is not any better at this time notes it is about the same as it was on last visit. Pt notes he stopped amlodipine and lisinopril thinking if he increased his blood pressure it would help    HPI Evan Bryant presents for   Follow-up from August 14 visit.  At that time was here for a hospital follow-up, where he was noted to have a nondisplaced fracture of the occipital skull base on the right with a small subdural hematoma on the left floor and medial wall of the anterior cranial fossa on the left without mass effect or shift.  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  Additionally had been noted to have hyponatremia with sodium 129.  Elevated hemoglobin at 18.8.  AST elevation of 51, calcium 10.4 and right middle ear effusion.  Due to time since initial injury no further workup indicated from neurosurgery.  He was treated with antibiotics for otitis media, meclizine for vertigo and planned ENT follow-up.  Salt tablets were given for hyponatremia.  He had not noticed improvement in dizziness with meclizine.  Tried to and that caused too much sedation, and no change in dizziness.  Denied headache or vomiting, denied new focal weakness or other new neurologic symptoms.  Had persistent hearing loss in the right ear, no change after completing Augmentin.  No pain in ear, or vision changes.  Continue to have dizziness, but that had not been present prior to the fall and hitting his head.  He had cut back on alcohol use to only 1 shot or drink every other day.  Unable to return to his physical job as of last visit. With his persistent dizziness and prior injury, cerebellar infarction, I referred him to neurology.  Cane or assistive device recommended and note provided for work.  Refer to ENT due to persistent hearing loss and  dizziness, ENT requested audiology eval first, recently ordered that referral. Hemoglobin improved on recheck, as well as only borderline hyponatremia. Appointment on September 4 with neurology.  Dizziness has persisted.  He made the decision himself to stop his lisinopril and amlodipine 2 days ago in the hopes that that would increase blood pressure and help with dizziness.  Previously on 5 mg amlodipine, 10 mg lisinopril.  Blood pressure was normal at his August 14 visit. No cahnge in sx's off the pills.  Still less alcohol - 1 drink every other day.  No HA, vomiting, focal weakness or new neurologic symptoms. Still feeling off balance to walk. No room spinning sensation. No falls. No use of cane now, feels like getting used to being off balance.  Has few meclizine - stopped as no change in symptoms.  Home BP 128/78 on meds. No low readings. Up to 157/110 of off meds more than a few days.  Still unable to hear out of R ear since July 15th - at time of injury. No pain. No left ear sx's.  Temporal bone CT on 7/24 without apparent concern on ear anatomy: "External auditory canal: Normal.  Tympanic membrane is normal. Middle ear cavity: No fluid in the middle ear. Ossicular chain is normal. The attic is clear. Inner ear structures: Inner ear structures are normal. No evidence of any temporal bone fracture/otic capsule injury. Internal auditory and facial  nerve canals: Normal. 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    BP Readings from Last 3 Encounters:  12/19/22 138/84  12/05/22 130/80  11/14/22 127/84     Lab Results  Component Value Date   WBC 10.0 12/05/2022   HGB 17.7 (H) 12/05/2022   HCT 51.5 12/05/2022   MCV 98.6 12/05/2022   PLT 219.0 12/05/2022   Lab Results  Component Value Date   NA 134 (L) 12/05/2022   K 4.5 12/05/2022   CL 97 12/05/2022   CO2 28 12/05/2022     History Patient Active Problem List   Diagnosis Date Noted   Fracture of occipital bone of skull with loss of consciousness (HCC) 12/05/2022   Subdural hematoma (HCC) 12/05/2022   Chronic hepatitis C without hepatic coma (HCC) 10/10/2021   Hypertension 10/14/2019   Family history of early CAD 10/14/2019   Tobacco abuse disorder 10/14/2019   Past Medical History:  Diagnosis Date   Hypertension    Past Surgical History:  Procedure Laterality Date   APPENDECTOMY     30s   No Known Allergies Prior to Admission medications   Medication Sig Start Date End Date Taking? Authorizing Provider  amLODipine (NORVASC) 5 MG tablet Take 1 tablet (5 mg total) by mouth daily. 09/14/22   Shade Flood, MD  amoxicillin-clavulanate (AUGMENTIN) 875-125 MG tablet Take 1 tablet by mouth every 12 (twelve) hours. 11/14/22   Sloan Leiter, DO  lisinopril (ZESTRIL) 10 MG tablet Take 1 tablet (10 mg total) by mouth daily. 09/14/22   Shade Flood, MD  meclizine (ANTIVERT) 25 MG tablet Take 1 tablet (25 mg total) by mouth 2 (two) times daily as needed for dizziness. 11/21/22   Shade Flood, MD   Social History   Socioeconomic History   Marital status: Married    Spouse name: Not on file   Number of children: Not on file   Years of education: Not on file   Highest education level: Not on file  Occupational History   Not on file  Tobacco Use   Smoking status: Every Day    Current packs/day: 1.00    Types: Cigarettes, Cigars   Smokeless tobacco: Never   Tobacco comments:    Reports he's been smoking since age 46  Vaping Use   Vaping status: Every Day  Substance and Sexual Activity   Alcohol use: Yes    Alcohol/week: 15.0 standard drinks of alcohol    Types: 15 Shots of liquor per week   Drug use: Never   Sexual activity: Yes    Birth control/protection: None    Comment: declined condoms  Other Topics Concern   Not on file  Social History Narrative   Not on file   Social  Determinants of Health   Financial Resource Strain: Not on file  Food Insecurity: Not on file  Transportation Needs: Not on file  Physical Activity: Not on file  Stress: Not on file  Social Connections: Not on file  Intimate Partner Violence: Not on file    Review of Systems   Objective:   Vitals:   12/19/22 1016  BP: 138/84  Pulse: 67  Temp: 97.9 F (36.6 C)  TempSrc: Temporal  SpO2: 97%  Weight: 129 lb 6.4 oz (58.7 kg)  Height: 6' (1.829 m)     Physical Exam Vitals reviewed.  Constitutional:  Appearance: He is well-developed.  HENT:     Right Ear: Tympanic membrane, ear canal and external ear normal. There is no impacted cerumen.     Left Ear: Tympanic membrane, ear canal and external ear normal. There is no impacted cerumen.  Neck:     Vascular: No carotid bruit or JVD.  Cardiovascular:     Rate and Rhythm: Normal rate and regular rhythm.     Heart sounds: Normal heart sounds. No murmur heard. Pulmonary:     Effort: Pulmonary effort is normal.     Breath sounds: Normal breath sounds. No rales.  Musculoskeletal:     Right lower leg: No edema.     Left lower leg: No edema.  Skin:    General: Skin is warm and dry.  Neurological:     Mental Status: He is alert and oriented to person, place, and time.     GCS: GCS eye subscore is 4. GCS verbal subscore is 5. GCS motor subscore is 6.     Cranial Nerves: No dysarthria or facial asymmetry.     Motor: No pronator drift.     Coordination: Heel to [facility] Iv Test normal. Rapid alternating movements normal.     Comments: Able to ambulate without assistive device but unsteady with standing, cautious with gait especially after bending forward, then standing up.  No focal weakness appreciated.  Psychiatric:        Mood and Affect: Mood normal.        Assessment & Plan:  Evan Bryant is a 62 y.o. male . Difficulty balancing  Cerebellar infarction (HCC)  Fracture of occipital bone of skull with loss of  consciousness (HCC)  Subdural hematoma (HCC)  Hearing loss of right ear, unspecified hearing loss type  Persistent dizziness/balance issues, question whether the previous cerebellar infarct may be contributing.  Advised to restart antihypertensives, assistive device such as cane for stability if needed and keep follow-up with neurology next week.  Audiology eval pending, then possible ENT eval.  Out of work note as needed for now, return to be determined from specialty eval.  RTC/ER precautions given.  No orders of the defined types were placed in this encounter.  Patient Instructions  Restart blood pressure meds as unlikely cause of balance issues and uncontrolled blood pressure puts you at risk for stroke and other health risks.  Keep follow up with neurology on September 4th. Prior cerebellar stroke or recent injury may be contributing to your balance issues. Use a cane for stability if outside of home.  I referred you to audiology for hearing test, then can have ENT follow up after that is performed.  I will provide a note for work for now. Be seen if any new symptoms.  Hang in there!      Signed,   Meredith Staggers, MD [facility], [facility] 12/19/22 11:04 AM

## 2022-12-19 NOTE — Patient Instructions (Addendum)
Restart blood pressure meds as unlikely cause of balance issues and uncontrolled blood pressure puts you at risk for stroke and other health risks.  Keep follow up with neurology on September 4th. Prior cerebellar stroke or recent injury may be contributing to your balance issues. Use a cane for stability if outside of home.  I referred you to audiology for hearing test, then can have ENT follow up after that is performed.  I will provide a note for work for now. Be seen if any new symptoms.  Hang in there!

## 2022-12-22 ENCOUNTER — Encounter: Payer: Self-pay | Admitting: Family Medicine

## 2022-12-26 ENCOUNTER — Other Ambulatory Visit (INDEPENDENT_AMBULATORY_CARE_PROVIDER_SITE_OTHER): Payer: Self-pay

## 2022-12-26 ENCOUNTER — Encounter: Payer: Self-pay | Admitting: Neurology

## 2022-12-26 ENCOUNTER — Ambulatory Visit (INDEPENDENT_AMBULATORY_CARE_PROVIDER_SITE_OTHER): Payer: Self-pay | Admitting: Neurology

## 2022-12-26 VITALS — BP 149/90 | HR 72 | Ht 72.0 in | Wt 131.0 lb

## 2022-12-26 DIAGNOSIS — S069X9A Unspecified intracranial injury with loss of consciousness of unspecified duration, initial encounter: Secondary | ICD-10-CM

## 2022-12-26 DIAGNOSIS — S02119A Unspecified fracture of occiput, initial encounter for closed fracture: Secondary | ICD-10-CM

## 2022-12-26 DIAGNOSIS — H9191 Unspecified hearing loss, right ear: Secondary | ICD-10-CM

## 2022-12-26 DIAGNOSIS — S065XAA Traumatic subdural hemorrhage with loss of consciousness status unknown, initial encounter: Secondary | ICD-10-CM

## 2022-12-26 DIAGNOSIS — R42 Dizziness and giddiness: Secondary | ICD-10-CM

## 2022-12-26 LAB — VITAMIN B12: Vitamin B-12: 335 pg/mL (ref 211–911)

## 2022-12-26 LAB — FOLATE: Folate: 9.2 ng/mL (ref >5.9)

## 2022-12-26 NOTE — Patient Instructions (Addendum)
I saw you for dizziness and right hearing loss. I'm concerned that this is the result of your fall and skull fracture. There may have been damage to your ear or the nerve responsible for balance and hearing on the right.  I would like to investigate further with: -Blood work today -MRI of your brain  I want you to see ENT (ear doctor). Please contact your primary care doctor that ordered the ENT referral if you do not hear from them.  We discussed vestibular rehab to help with balance, but you preferred to talk to ENT first.  I will be in touch when I have your results and will see you back in clinic in 2 months to check your progress.  The physicians and staff at [facility] are committed to providing excellent care. You may receive a survey requesting feedback about your experience at our office. We strive to receive "very good" responses to the survey questions. If you feel that your experience would prevent you from giving the office a "very good " response, please contact our office to try to remedy the situation. We may be reached at [phone]. Thank you for taking the time out of your busy day to complete the survey.  Jacquelyne Balint, MD [facility]  Preventing Falls at [facility] are common, often dreaded events in the lives of older people. Aside from the obvious injuries and even death that may result, fall can cause wide-ranging consequences including loss of independence, mental decline, decreased activity and mobility. Younger people are also at risk of falling, especially those with chronic illnesses and fatigue.  Ways to reduce risk for falling Examine diet and medications. Warm foods and alcohol dilate blood vessels, which can lead to dizziness when standing. Sleep aids, antidepressants and pain medications can also increase the likelihood of a fall.  Get a vision exam. Poor vision, cataracts and glaucoma increase the chances of falling.  Check foot gear. Shoes  should fit snugly and have a sturdy, nonskid sole and a broad, low heel  Participate in a physician-approved exercise program to build and maintain muscle strength and improve balance and coordination. Programs that use ankle weights or stretch bands are excellent for muscle-strengthening. Water aerobics programs and low-impact Tai Chi programs have also been shown to improve balance and coordination.  Increase vitamin D intake. Vitamin D improves muscle strength and increases the amount of calcium the body is able to absorb and deposit in bones.  How to prevent falls from common hazards Floors - Remove all loose wires, cords, and throw rugs. Minimize clutter. Make sure rugs are anchored and smooth. Keep furniture in its usual place.  Chairs -- Use chairs with straight backs, armrests and firm seats. Add firm cushions to existing pieces to add height.  Bathroom - Install grab bars and non-skid tape in the tub or shower. Use a bathtub transfer bench or a shower chair with a back support Use an elevated toilet seat and/or safety rails to assist standing from a low surface. Do not use towel racks or bathroom tissue holders to help you stand.  Lighting - Make sure halls, stairways, and entrances are well-lit. Install a night light in your bathroom or hallway. Make sure there is a light switch at the top and bottom of the staircase. Turn lights on if you get up in the middle of the night. Make sure lamps or light switches are within reach of the bed if you have to get up during  the night.  Kitchen - Install non-skid rubber mats near the sink and stove. Clean spills immediately. Store frequently used utensils, pots, pans between waist and eye level. This helps prevent reaching and bending. Sit when getting things out of lower cupboards.  Living room/ Bedrooms - Place furniture with wide spaces in between, giving enough room to move around. Establish a route through the living room that gives you something  to hold onto as you walk.  Stairs - Make sure treads, rails, and rugs are secure. Install a rail on both sides of the stairs. If stairs are a threat, it might be helpful to arrange most of your activities on the lower level to reduce the number of times you must climb the stairs.  Entrances and doorways - Install metal handles on the walls adjacent to the doorknobs of all doors to make it more secure as you travel through the doorway.  Tips for maintaining balance Keep at least one hand free at all times. Try using a backpack or fanny pack to hold things rather than carrying them in your hands. Never carry objects in both hands when walking as this interferes with keeping your balance.  Attempt to swing both arms from front to back while walking. This might require a conscious effort if Parkinson's disease has diminished your movement. It will, however, help you to maintain balance and posture, and reduce fatigue.  Consciously lift your feet off of the ground when walking. Shuffling and dragging of the feet is a common culprit in losing your balance.  When trying to navigate turns, use a "U" technique of facing forward and making a wide turn, rather than pivoting sharply.  Try to stand with your feet shoulder-length apart. When your feet are close together for any length of time, you increase your risk of losing your balance and falling.  Do one thing at a time. Don't try to walk and accomplish another task, such as reading or looking around. The decrease in your automatic reflexes complicates motor function, so the less distraction, the better.  Do not wear rubber or gripping soled shoes, they might "catch" on the floor and cause tripping.  Move slowly when changing positions. Use deliberate, concentrated movements and, if needed, use a grab bar or walking aid. Count 15 seconds between each movement. For example, when rising from a seated position, wait 15 seconds after standing to begin  walking.  If balance is a continuous problem, you might want to consider a walking aid such as a cane, walking stick, or walker. Once you've mastered walking with help, you might be ready to try it on your own again.

## 2023-01-03 LAB — VITAMIN B1: Vitamin B1 (Thiamine): <6 nmol/L — ABNORMAL LOW (ref 8–30)

## 2023-01-03 LAB — VITAMIN E
Gamma-Tocopherol (Vit E): 2.6 mg/L (ref <4.4)
Vitamin E (Alpha Tocopherol): 12.1 mg/L (ref 5.7–19.9)

## 2023-01-03 LAB — COPPER, SERUM: Copper: 162 ug/dL (ref 70–175)

## 2023-01-22 ENCOUNTER — Ambulatory Visit: Payer: Medicaid Other | Attending: Family Medicine | Admitting: Audiologist

## 2023-01-22 DIAGNOSIS — R42 Dizziness and giddiness: Secondary | ICD-10-CM | POA: Insufficient documentation

## 2023-01-22 DIAGNOSIS — S0219XS Other fracture of base of skull, sequela: Secondary | ICD-10-CM | POA: Insufficient documentation

## 2023-01-22 DIAGNOSIS — H9191 Unspecified hearing loss, right ear: Secondary | ICD-10-CM | POA: Diagnosis present

## 2023-01-22 DIAGNOSIS — H9311 Tinnitus, right ear: Secondary | ICD-10-CM | POA: Insufficient documentation

## 2023-01-22 DIAGNOSIS — H90A22 Sensorineural hearing loss, unilateral, left ear, with restricted hearing on the contralateral side: Secondary | ICD-10-CM | POA: Diagnosis present

## 2023-01-22 NOTE — Procedures (Signed)
  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  After riding a roller coaster he felt dizzy and fell hitting his head on a table and then a hard floor.  He was sent to the emergency doctor by his primary care physician.  He received a CT scan showing a fracture of the right temporal bone.  He was also referred to otolaryngology.  This appointment has not been scheduled.  He is now having severe constant dizziness, tinnitus, and aural fullness in the right ear.  He sometimes feels like the right ear is full of fluid and then swallows his ear pops, and the feeling goes away.  No other case history reported.    Evaluation:  Otoscopy showed a clear view of the tympanic membranes, bilaterally Tympanometry results were consistent with normal middle ear function bilaterally Audiometric testing was completed using Conventional Audiometry techniques with insert earphones and TDH headphones. Test results are consistent with profound sensorineural hearing loss of the right ear with no response above 500 Hz.  Left ear shows mild high-frequency sensorineural hearing loss consistent with age-related change. Speech Recognition Thresholds showed no response at 90 dB to speech.  Speech detection obtained at 100 dB. SRT at 25 dB HL in the left ear. Word Recognition Testing was completed at 65 dB  HL in the left ear only and Evan Bryant scored 100%.  Results:  The test results were reviewed with Evan Bryant.  He has a profound hearing loss in the right ear.  He has no usable hearing and is currently not a hearing aid candidate.  He needs to see otolaryngology and have an evaluation of his right inner ear, this part of the ear is responsible for both hearing and dizziness.  He was given a copy of his audiogram and told to call [facility] to schedule.    Recommendations: 1.   Follow-up with otolaryngology due to profound sensorineural hearing loss of the right ear after a fracture of the right temporal bone.  Options for Cros device, complete cochlear implant, or bone-conduction device were not discussed.  Recommend he see a fitting audiologist after his ENT appointment to discuss management options.   42 minutes spent testing and counseling on results.   If you have any questions please feel free to contact me at [phone].  Ammie Ferrier Audiologist, Au.D., CCC-A 01/22/2023  1:36 PM  Cc: Shade Flood, MD

## 2023-02-04 ENCOUNTER — Ambulatory Visit (INDEPENDENT_AMBULATORY_CARE_PROVIDER_SITE_OTHER): Payer: Self-pay | Admitting: Otolaryngology

## 2023-02-04 ENCOUNTER — Ambulatory Visit (INDEPENDENT_AMBULATORY_CARE_PROVIDER_SITE_OTHER): Payer: Self-pay | Admitting: Audiology

## 2023-02-04 ENCOUNTER — Encounter (INDEPENDENT_AMBULATORY_CARE_PROVIDER_SITE_OTHER): Payer: Self-pay

## 2023-02-04 VITALS — Ht 72.0 in | Wt 131.0 lb

## 2023-02-04 DIAGNOSIS — H9311 Tinnitus, right ear: Secondary | ICD-10-CM

## 2023-02-04 DIAGNOSIS — H90A31 Mixed conductive and sensorineural hearing loss, unilateral, right ear with restricted hearing on the contralateral side: Secondary | ICD-10-CM

## 2023-02-04 DIAGNOSIS — R42 Dizziness and giddiness: Secondary | ICD-10-CM | POA: Insufficient documentation

## 2023-02-04 DIAGNOSIS — H90A22 Sensorineural hearing loss, unilateral, left ear, with restricted hearing on the contralateral side: Secondary | ICD-10-CM

## 2023-02-04 DIAGNOSIS — H9041 Sensorineural hearing loss, unilateral, right ear, with unrestricted hearing on the contralateral side: Secondary | ICD-10-CM

## 2023-02-04 NOTE — Progress Notes (Signed)
[facility] [address], [address] [place], [place] [address]  Audiological Evaluation   Evan Bryant was referred today for a hearing evaluation by Dr. Janeece Riggers Philomena Doheny. Patient reported falling down around August 23rd, 2024, where he hit the right side of his head.  History: Symptoms Yes/No Details  Hearing Loss Yes Patient reported right, severe hearing loss since the fall with no concerns for the left ear at this time.  Tinnitus Yes Patient reported right ear tinnitus.  Balance Problems Yes Patient reported imbalance since the fall.  Previous ear surgeries No Patient denied any previous ear surgeries.  Family History No Patient denied family history of hearing loss.  Amplification No Patient denied the use of hearing aids.   Otoscopy: Right ear: Clear external ear canals and normal landmarks in the tympanic membrane. Left ear: Clear external ear canals and normal landmarks in the tympanic membrane.   Tympanogram: Right ear: Normal external ear canal volume with normal middle ear pressure and tympanic membrane compliance (Type A). Left ear: Normal external ear canal volume with normal middle ear pressure and tympanic membrane compliance (Type A).   Hearing Evaluation: The audiogram was completed using conventional audiometric techniques under headphones with good reliability.   The hearing test results indicate: Right ear: Severe mixed hearing loss from [phone] Hz sloping to profound mixed hearing loss from 2000-3000 Hz with no responses recorded from 4000-8000 Hz. Left ear: Normal hearing sensitivity from [phone] Hz sloping to moderate sensorineural hearing loss from 6000-8000 Hz. There is a significant difference between the ears for pure tone thresholds.   Speech Recognition Thresholds were obtained at 85 dBHL masked in the right ear and 20 dBHL in the left ear.   Word Recognition Testing was completed using the NU-6 word lists at 60 dBHL in the left ear and the  patient scored 100%. The right ear was not tested at this time.   Recommendations: Repeat audiogram when changes are perceived or per MD. Patient is a candidate for hearing amplification, consider a hearing aid evaluation. Consider various tinnitus strategies, including the use of a noise generator, hearing aids, or tinnitus retraining therapy.   Conley Rolls Eldred Sooy, AUD, CCC-A 02/04/23

## 2023-02-05 DIAGNOSIS — H9041 Sensorineural hearing loss, unilateral, right ear, with unrestricted hearing on the contralateral side: Secondary | ICD-10-CM | POA: Insufficient documentation

## 2023-02-05 DIAGNOSIS — H9311 Tinnitus, right ear: Secondary | ICD-10-CM | POA: Insufficient documentation

## 2023-02-05 NOTE — Progress Notes (Signed)
Patient ID: Evan Bryant, male   DOB: 1960-10-29, 62 y.o.   MRN: [id number]  Cc: Chronic dizziness, right ear hearing loss, right ear tinnitus  HPI:  Evan Bryant is an 62 y.o. male who presents today complaining of chronic dizziness, right ear hearing loss, and right ear tinnitus.  According to the patient, his symptoms started on December 14, 2022, after he had an accidental fall, hitting the right side of his head on the ground.  His head CT scan showed skull base and occipital bone fractures.  However, no obvious temporal bone fracture was noted.  Since his injury, he has been having difficulty with his balance.  He complains of chronic lightheaded and dizzy sensations.  In addition, he also reports complete loss of his right ear hearing.  He has a constant high-pitched right ear tinnitus.  Currently he denies any otalgia, otorrhea, or spinning vertigo.  He has no previous ENT surgery.  Past Medical History:  Diagnosis Date   Hypertension     Past Surgical History:  Procedure Laterality Date   APPENDECTOMY     30s    Family History  Problem Relation Age of Onset   Arthritis Mother    Hearing loss Mother    Heart disease Mother    Hyperlipidemia Mother    Hypertension Mother    Kidney disease Mother    Miscarriages / [place] Mother    Stroke Mother    Heart attack Father    Heart disease Father    Hypertension Father    Stroke Father    Asthma Sister    Early death Sister    Heart disease Sister    Hypertension Sister    Alcohol abuse Brother    Asthma Brother    Depression Brother    Drug abuse Brother    Heart attack Brother    Hypertension Brother    Mental illness Brother    Stroke Brother    Early death Son    Cancer Sister        Breast Cancer   Heart attack Sister    Heart disease Sister    Hypertension Sister     Social History:  reports that he has been smoking cigarettes and cigars. He has never used smokeless tobacco. He reports current alcohol use  of about 15.0 standard drinks of alcohol per week. He reports that he does not use drugs.  Allergies: No Known Allergies  Prior to Admission medications   Medication Sig Start Date End Date Taking? Authorizing Provider  amLODipine (NORVASC) 5 MG tablet Take 1 tablet (5 mg total) by mouth daily. 09/14/22  Yes Shade Flood, MD  lisinopril (ZESTRIL) 10 MG tablet Take 1 tablet (10 mg total) by mouth daily. 09/14/22  Yes Shade Flood, MD  amoxicillin-clavulanate (AUGMENTIN) 875-125 MG tablet Take 1 tablet by mouth every 12 (twelve) hours. Patient not taking: Reported on 12/26/2022 11/14/22   Sloan Leiter, DO  meclizine (ANTIVERT) 25 MG tablet Take 1 tablet (25 mg total) by mouth 2 (two) times daily as needed for dizziness. Patient not taking: Reported on 12/26/2022 11/21/22   Shade Flood, MD    Height 6' (1.829 m), weight 59.4 kg. Exam: General: Communicates without difficulty, well nourished, no acute distress. Head: Normocephalic, no evidence injury, no tenderness, facial buttresses intact without stepoff. Face/sinus: No tenderness to palpation and percussion. Facial movement is normal and symmetric. Eyes: PERRL, EOMI. No scleral icterus, conjunctivae clear. Neuro: CN II exam  reveals vision grossly intact.  No nystagmus at any point of gaze. Ears: Auricles well formed without lesions.  Ear canals are intact without mass or lesion.  No erythema or edema is appreciated.  The TMs are intact without fluid. Nose: External evaluation reveals normal support and skin without lesions.  Dorsum is intact.  Anterior rhinoscopy reveals congested mucosa over anterior aspect of inferior turbinates and intact septum.  No purulence noted. Oral:  Oral cavity and oropharynx are intact, symmetric, without erythema or edema.  Mucosa is moist without lesions. Neck: Full range of motion without pain.  There is no significant lymphadenopathy.  No masses palpable.  Thyroid bed within normal limits to palpation.   Parotid glands and submandibular glands equal bilaterally without mass.  Trachea is midline. Neuro:  CN 2-12 grossly intact.  Vestibular: No nystagmus at any point of gaze. Dix Hallpike negative. Vestibular: There is no nystagmus with pneumatic pressure on either tympanic membrane or Valsalva. The cerebellar examination is unremarkable.   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  Normal tympanogram bilaterally.  Assessment: 1.  Chronic dizziness, likely secondary to concussive injury to his right inner ear. 2.  Sudden onset right ear severe to profound hearing loss, secondary to his head injury. 3.  His right ear tinnitus is likely a direct result of his hearing loss.  Plan: 1.  The physical exam findings and the hearing test results are reviewed with the patient. 2.  The pathophysiology of dizziness and vestibular dysfunction are extensively discussed with the patient.  Questions were invited and answered. 3.  It is explained to the patient that his right ear hearing loss is likely permanent. 4.  The patient is a candidate for the CROS hearing aid. 5.  The strategies to cope with tinnitus, including the use of masker, hearing aids, tinnitus retraining therapy, and avoidance of caffeine and alcohol are discussed. 6.  The patient will benefit from vestibular rehabilitation with a physical therapist.  A referral will be arranged soon as possible.  Massiah Minjares W Erina Hamme 02/05/2023, 9:20 AM

## 2023-02-21 NOTE — Progress Notes (Signed)
NEUROLOGY FOLLOW UP OFFICE NOTE  [name] [id number]  Subjective:  Evan Bryant is a 62 y.o. year old right-handed male with a medical history of HTN, SDH (10/2022), current smoker, EtOH abuse who we last saw on 12/26/22 for dizziness and right sided hearing loss.  To briefly review: Patient was at the beach and endorses drinking EtOH on 11/05/22. He went on a wooden roller coaster and when he got off, he was stumbling all over the place. He was not like this prior to the roller coaster. He seemed more "drunk" at that point. Girlfriend is unsure how drunk he was and maybe this was why he was stumbling. He was helped to the hotel by his girlfriend. He got another drink and went to the balcony to smoke. As he was coming in, he tripped and hit his head on concrete floor. After he hit his head, he had extreme dizziness and not able to hear at all in the right ear. He describes the dizziness as when he stands or bends over he stumbles. If he is up for a while, symptoms improve some. There are no symptoms when he is sitting or driving or moving his head. He does not get dizzy when rolling over in bed. He feels like he has "water on the ear".   Patient presented to the ED on 11/14/22 with dizziness and hearing loss. CT head showed a nondisplaced fracture of the occipital skull base on right with small subdural hematoma along the floor and medial wall of the anterior cranial fossa. 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 Symptoms improved in ED with IVF and antivert. Neurosurgery indicated no further work up or surgical management was required. He was also started on antibiotics for acute otitis media, meclizine for vertigo, and referred to ENT. He had hyponatremia and was started on salt tabs.   Patient then followed up with PCP on 12/05/22 and had no change in dizziness and meclizine has not helped. He is still unable to hear in right ear. This continues to be true. He feels symptoms  are exactly the same since symptom onset after the fall. Patient has not seen ENT though he has been referred. He has not heard from anyone.   Patient currently has no headache. He denies diplopia, vision changes, difficulty swallowing, weakness or numbness or tingling.   EtOH use: typically a shot per day and maybe some beer; has had to stop over last week due to severe drunkenness if he drinks since fall Restriction diet: no  Most recent Assessment and Plan (12/26/22): Evan Bryant is a 62 y.o. male who presents for evaluation of dizziness and right sided hearing loss. He has a relevant medical history of HTN, right occipital skull fracture and SDH (10/2022), current smoker, EtOH abuse. His neurological examination is pertinent for hearing loss in right ear and gait imbalance. 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 Patient's symptoms appear to have started after his fall and skull fracture, definitely the right sided hearing loss. If his imbalance after the roller coaster was due to the coaster or EtOH use is not as clear to me, but is concerning. The fall may have cause a problem in the right ear or to the right CN VIII. He also had an old appearing right cerebellar stroke that could potentially cause imbalance. Dissection from roller coaster with subsequent  posterior circulation stroke is also a possible cause of the dizziness that lead to his fall. I will work up further as below.   PLAN: -Blood work: B1, B12, folate, copper, vit E -MRI brain w/wo contrast -Discussed vestibular rehab, patient would rather see ENT first -See ENT - referral given by PCP  Since their last visit: Blood work was significant for low B1 and borderline low B12. I recommended B1 100 mg daily and B12 1000 mcg daily. He is taking these.  He continues to have hearing loss and tinnitus in right ear. The dizziness  has improved. He still has imbalance when getting up or changing position. This resolves after a few seconds. He has not had any further falls.  Patient did not schedule his MRI brain. He never got a call to schedule this.  Patient saw ENT on 02/04/23. He said he was told his deficits were likely permanent without much that could be done. Per note by Dr. Suszanne Conners: His hearing test shows asymmetric right ear severe to profound sensorineural hearing loss.  Normal tympanogram bilaterally.   Assessment: 1.  Chronic dizziness, likely secondary to concussive injury to his right inner ear. 2.  Sudden onset right ear severe to profound hearing loss, secondary to his head injury. 3.  His right ear tinnitus is likely a direct result of his hearing loss.   Plan: 1.  The physical exam findings and the hearing test results are reviewed with the patient. 2.  The pathophysiology of dizziness and vestibular dysfunction are extensively discussed with the patient.  Questions were invited and answered. 3.  It is explained to the patient that his right ear hearing loss is likely permanent. 4.  The patient is a candidate for the CROS hearing aid. 5.  The strategies to cope with tinnitus, including the use of masker, hearing aids, tinnitus retraining therapy, and avoidance of caffeine and alcohol are discussed. 6.  The patient will benefit from vestibular rehabilitation with a physical therapist.  A referral will be arranged soon as possible.  MEDICATIONS:  Outpatient Encounter Medications as of 03/01/2023  Medication Sig   amLODipine (NORVASC) 5 MG tablet Take 1 tablet (5 mg total) by mouth daily.   cyanocobalamin (VITAMIN B12) 1000 MCG tablet Take 1,000 mcg by mouth daily.   lisinopril (ZESTRIL) 10 MG tablet Take 1 tablet (10 mg total) by mouth daily.   thiamine (VITAMIN B-1) 100 MG tablet Take 100 mg by mouth daily.   amoxicillin-clavulanate (AUGMENTIN) 875-125 MG tablet Take 1 tablet by mouth every 12 (twelve)  hours. (Patient not taking: Reported on 12/26/2022)   meclizine (ANTIVERT) 25 MG tablet Take 1 tablet (25 mg total) by mouth 2 (two) times daily as needed for dizziness. (Patient not taking: Reported on 12/26/2022)   No facility-administered encounter medications on file as of 03/01/2023.    PAST MEDICAL HISTORY: Past Medical History:  Diagnosis Date   Hypertension     PAST SURGICAL HISTORY: Past Surgical History:  Procedure Laterality Date   APPENDECTOMY     30s    ALLERGIES: No Known Allergies  FAMILY HISTORY: Family History  Problem Relation Age of Onset   Arthritis Mother    Hearing loss Mother    Heart disease Mother    Hyperlipidemia Mother    Hypertension Mother    Kidney disease Mother    Miscarriages / [place] Mother    Stroke Mother    Heart attack Father    Heart disease Father    Hypertension  Father    Stroke Father    Asthma Sister    Early death Sister    Heart disease Sister    Hypertension Sister    Alcohol abuse Brother    Asthma Brother    Depression Brother    Drug abuse Brother    Heart attack Brother    Hypertension Brother    Mental illness Brother    Stroke Brother    Early death Son    Cancer Sister        Breast Cancer   Heart attack Sister    Heart disease Sister    Hypertension Sister     SOCIAL HISTORY: Social History   Tobacco Use   Smoking status: Every Day    Current packs/day: 1.00    Types: Cigarettes, Cigars   Smokeless tobacco: Never   Tobacco comments:    Reports he's been smoking since age 10- 1 and 1/2 packs a day(03/01/23)  Vaping Use   Vaping status: Every Day   Devices: just for fun  Substance Use Topics   Alcohol use: Yes    Alcohol/week: 15.0 standard drinks of alcohol    Types: 15 Shots of liquor per week    Comment: none in a week   Drug use: Never   Social History   Social History Narrative   Are you right handed or left handed? Right   What is your current occupation?   Do you live at home  alone? [name]   Who lives with you? With girlfriend   What type of home do you live in: 1 story or 2 story? one    Caffeine a lot 10 cups a day      Objective:  Vital Signs:  BP (!) 150/94   Pulse 83   Ht 6' (1.829 m)   Wt 134 lb (60.8 kg)   SpO2 99%   BMI 18.17 kg/m   General: No acute distress.  Patient appears well-groomed.   Neck: supple, full range of motion Lungs: Non-labored breathing on room air   Neurological Exam: Mental status: alert and oriented, speech fluent and not dysarthric, language intact.  Cranial nerves: CN I: not tested CN II: pupils equal, round and reactive to light, visual fields intact CN III, IV, VI:  full range of motion, no nystagmus, no ptosis CN V: facial sensation intact. CN VII: upper and lower face symmetric CN VIII: hearing loss in right ear. CN IX, X: uvula midline CN XI: sternocleidomastoid and trapezius muscles intact CN XII: tongue midline  Bulk & Tone: normal, no fasciculations. Motor:  muscle strength 5/5 throughout Deep Tendon Reflexes:  2-3+ throughout  Sensation:  Light sensation intact. Finger to nose testing:  Without dysmetria.     Gait:  Normal-based, mildly unsteady, leans to right when unsteady.  Romberg negative.  Labs and Imaging review: New results: 12/26/22: Vit E wnl Folate wnl Copper wnl B12: 335 B1: < 6  Hearing test (02/04/23): The hearing test results indicate: Right ear: Severe mixed hearing loss from [phone] sloping to profound mixed hearing loss from 2000-3000 Hz with no responses recorded from 4000-8000 Hz. Left ear: Normal hearing sensitivity from [phone] sloping to moderate sensorineural hearing loss from 6000-8000 Hz. There is a significant difference between the ears for pure tone thresholds.  Previously reviewed results: 12/05/22: BMP: Na 134 CBC: Hb 17.7   11/14/22: Mg: 2.2 CMP: Na 129, glucose 126, AST 51, ALT wnl CBC: Hb 18.8   Imaging:  CT head wo contrast  (11/14/22): FINDINGS: Brain: Normal appearance of the brainstem. 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 Cerebral hemispheres do not show evidence of an old small or large vessel stroke. There is a small subdural hematoma along the floor and medial wall of the anterior cranial fossa on the left. No mass effect or shift. No sign of intraparenchymal hemorrhage. No hydrocephalus.   Vascular: No abnormal vascular finding.   Skull: Linear nondisplaced fracture of the occipital skull base on the right. Occipital calvarium on the right. No other skull fracture. No evidence that the fracture actually involves the right temporal bone. One could question a subtle transverse fracture in the region of the otic capsule but I do not think this is definite. Consider high-resolution temporal bone CT.   Sinuses/Orbits: Clear/normal   Other: No fluid in the middle ears or mastoid air cells.   IMPRESSION: 1. Linear nondisplaced fracture of the occipital skull base on the right. No evidence that the fracture actually involves the right temporal bone. Fracture extends back into the right occipital calvarium. As there is a description of hearing loss on the right following the fall, consider a temporal bone CT for high-resolution evaluation of the right temporal bone. 2. Small subdural hematoma along the floor and medial wall of the anterior cranial fossa on the left. No mass effect or shift. 3. 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   CT temporal bones wo contrast (11/14/22): FINDINGS: RIGHT TEMPORAL BONE   External auditory canal: Normal.  Tympanic membrane is normal.   Middle ear cavity: No fluid in the middle ear. Ossicular chain is normal. The attic is clear.   Inner ear structures: Inner ear structures are normal. No evidence of any temporal bone fracture/otic capsule injury.   Internal auditory and facial nerve canals: Normal. Unaffected by the skull base  fracture.   Mastoid air cells: Clear.   LEFT TEMPORAL BONE   External auditory canal: Normal.  Tympanic membrane is normal.   Middle ear cavity: Ossicular chain is normal. No fluid in the middle ear or attic.   Inner ear structures: Normal   Internal auditory and facial nerve canals:  Normal   Mastoid air cells: Clear except for a few opacified air cells at the mastoid tip, not significant.   Vascular: Normal   Limited intracranial: As previously shown, there is a small amount of subdural blood along the inferior surface of the anterior cranial fossa on the left.   Visible orbits/paranasal sinuses: Normal   Soft tissues: Otherwise normal   IMPRESSION: 1. No evidence of temporal bone fracture/otic capsule injury. No fluid in the middle ear or mastoid air cells. 2. Small amount of subdural blood along the inferior surface of the anterior cranial fossa on the left, as previously shown by head CT. 3. Skull base and occipital bone fracture on the right as previously shown.  Assessment/Plan:  This is Yates Decamp, a 62 y.o. male with: Hearing loss and imbalance - also following with ENT. Likely due to fall and injury to right inner ear B1 deficiency Borderline B12 deficiency EtOH abuse  Plan: -Will reorder MRI of brain w/wo contrast. Could consider cervical spine imaging if MRI brain is unrevealing. -Continue B12 1000 mcg daily -Continue B1 100 mg daily -Will be starting vestibular rehab -Discussed cutting back on EtOH would help prevent future worsening  Return to clinic in 6 months   Jacquelyne Balint, MD

## 2023-02-22 DIAGNOSIS — Z419 Encounter for procedure for purposes other than remedying health state, unspecified: Secondary | ICD-10-CM | POA: Diagnosis not present

## 2023-03-01 ENCOUNTER — Encounter: Payer: Self-pay | Admitting: Neurology

## 2023-03-01 ENCOUNTER — Ambulatory Visit: Payer: Medicaid Other | Admitting: Neurology

## 2023-03-01 VITALS — BP 150/94 | HR 83 | Ht 72.0 in | Wt 134.0 lb

## 2023-03-01 DIAGNOSIS — R42 Dizziness and giddiness: Secondary | ICD-10-CM

## 2023-03-01 DIAGNOSIS — H9191 Unspecified hearing loss, right ear: Secondary | ICD-10-CM | POA: Diagnosis not present

## 2023-03-01 DIAGNOSIS — S02119A Unspecified fracture of occiput, initial encounter for closed fracture: Secondary | ICD-10-CM

## 2023-03-01 DIAGNOSIS — F1010 Alcohol abuse, uncomplicated: Secondary | ICD-10-CM

## 2023-03-01 DIAGNOSIS — S065XAA Traumatic subdural hemorrhage with loss of consciousness status unknown, initial encounter: Secondary | ICD-10-CM

## 2023-03-01 NOTE — Patient Instructions (Addendum)
I will reorder the MRI of your brain. If you do not hear from anyone in 1-2 weeks, please let us know or call the number below to schedule.  A referral to [facility] has been placed for your MRI someone will contact you directly to schedule your appt. They are located at [address] [facility]. Please contact them directly by calling [phone] with any questions regarding your referral.   Continue B12 1000 mcg daily Continue B1 100 mg daily  Start vestibular rehab as planned.  We discussed cutting back on alcohol as this would help prevent future worsening of your symptoms and prevent future falls.  Return to clinic in 6 months  The physicians and staff at [facility] are committed to providing excellent care. You may receive a survey requesting feedback about your experience at our office. We strive to receive "very good" responses to the survey questions. If you feel that your experience would prevent you from giving the office a "very good " response, please contact our office to try to remedy the situation. We may be reached at [phone]. Thank you for taking the time out of your busy day to complete the survey.  Jacquelyne Balint, MD [facility]  Preventing Falls at [facility] are common, often dreaded events in the lives of older people. Aside from the obvious injuries and even death that may result, fall can cause wide-ranging consequences including loss of independence, mental decline, decreased activity and mobility. Younger people are also at risk of falling, especially those with chronic illnesses and fatigue.  Ways to reduce risk for falling Examine diet and medications. Warm foods and alcohol dilate blood vessels, which can lead to dizziness when standing. Sleep aids, antidepressants and pain medications can also increase the likelihood of a fall.  Get a vision exam. Poor vision, cataracts and glaucoma increase the chances of falling.  Check foot gear.  Shoes should fit snugly and have a sturdy, nonskid sole and a broad, low heel  Participate in a physician-approved exercise program to build and maintain muscle strength and improve balance and coordination. Programs that use ankle weights or stretch bands are excellent for muscle-strengthening. Water aerobics programs and low-impact Tai Chi programs have also been shown to improve balance and coordination.  Increase vitamin D intake. Vitamin D improves muscle strength and increases the amount of calcium the body is able to absorb and deposit in bones.  How to prevent falls from common hazards Floors - Remove all loose wires, cords, and throw rugs. Minimize clutter. Make sure rugs are anchored and smooth. Keep furniture in its usual place.  Chairs -- Use chairs with straight backs, armrests and firm seats. Add firm cushions to existing pieces to add height.  Bathroom - Install grab bars and non-skid tape in the tub or shower. Use a bathtub transfer bench or a shower chair with a back support Use an elevated toilet seat and/or safety rails to assist standing from a low surface. Do not use towel racks or bathroom tissue holders to help you stand.  Lighting - Make sure halls, stairways, and entrances are well-lit. Install a night light in your bathroom or hallway. Make sure there is a light switch at the top and bottom of the staircase. Turn lights on if you get up in the middle of the night. Make sure lamps or light switches are within reach of the bed if you have to get up during the night.  Kitchen - Install non-skid  rubber mats near the sink and stove. Clean spills immediately. Store frequently used utensils, pots, pans between waist and eye level. This helps prevent reaching and bending. Sit when getting things out of lower cupboards.  Living room/ Bedrooms - Place furniture with wide spaces in between, giving enough room to move around. Establish a route through the living room that gives you  something to hold onto as you walk.  Stairs - Make sure treads, rails, and rugs are secure. Install a rail on both sides of the stairs. If stairs are a threat, it might be helpful to arrange most of your activities on the lower level to reduce the number of times you must climb the stairs.  Entrances and doorways - Install metal handles on the walls adjacent to the doorknobs of all doors to make it more secure as you travel through the doorway.  Tips for maintaining balance Keep at least one hand free at all times. Try using a backpack or fanny pack to hold things rather than carrying them in your hands. Never carry objects in both hands when walking as this interferes with keeping your balance.  Attempt to swing both arms from front to back while walking. This might require a conscious effort if Parkinson's disease has diminished your movement. It will, however, help you to maintain balance and posture, and reduce fatigue.  Consciously lift your feet off of the ground when walking. Shuffling and dragging of the feet is a common culprit in losing your balance.  When trying to navigate turns, use a "U" technique of facing forward and making a wide turn, rather than pivoting sharply.  Try to stand with your feet shoulder-length apart. When your feet are close together for any length of time, you increase your risk of losing your balance and falling.  Do one thing at a time. Don't try to walk and accomplish another task, such as reading or looking around. The decrease in your automatic reflexes complicates motor function, so the less distraction, the better.  Do not wear rubber or gripping soled shoes, they might "catch" on the floor and cause tripping.  Move slowly when changing positions. Use deliberate, concentrated movements and, if needed, use a grab bar or walking aid. Count 15 seconds between each movement. For example, when rising from a seated position, wait 15 seconds after standing to  begin walking.  If balance is a continuous problem, you might want to consider a walking aid such as a cane, walking stick, or walker. Once you've mastered walking with help, you might be ready to try it on your own again.

## 2023-03-24 DIAGNOSIS — Z419 Encounter for procedure for purposes other than remedying health state, unspecified: Secondary | ICD-10-CM | POA: Diagnosis not present

## 2023-03-25 ENCOUNTER — Ambulatory Visit: Payer: Medicaid Other | Admitting: Family Medicine

## 2023-04-24 DIAGNOSIS — Z419 Encounter for procedure for purposes other than remedying health state, unspecified: Secondary | ICD-10-CM | POA: Diagnosis not present

## 2023-05-25 DIAGNOSIS — Z419 Encounter for procedure for purposes other than remedying health state, unspecified: Secondary | ICD-10-CM | POA: Diagnosis not present

## 2023-06-22 DIAGNOSIS — Z419 Encounter for procedure for purposes other than remedying health state, unspecified: Secondary | ICD-10-CM | POA: Diagnosis not present

## 2023-08-03 DIAGNOSIS — Z419 Encounter for procedure for purposes other than remedying health state, unspecified: Secondary | ICD-10-CM | POA: Diagnosis not present

## 2023-08-10 ENCOUNTER — Encounter: Payer: Self-pay | Admitting: Neurology

## 2023-08-13 ENCOUNTER — Ambulatory Visit
Admission: RE | Admit: 2023-08-13 | Discharge: 2023-08-13 | Disposition: A | Discharge status: Home / Self Care | Source: Ambulatory Visit | Attending: Neurology | Admitting: Neurology

## 2023-08-13 DIAGNOSIS — S02119A Unspecified fracture of occiput, initial encounter for closed fracture: Secondary | ICD-10-CM

## 2023-08-13 DIAGNOSIS — R42 Dizziness and giddiness: Secondary | ICD-10-CM

## 2023-08-13 DIAGNOSIS — H9191 Unspecified hearing loss, right ear: Secondary | ICD-10-CM

## 2023-08-13 DIAGNOSIS — S065XAA Traumatic subdural hemorrhage with loss of consciousness status unknown, initial encounter: Secondary | ICD-10-CM

## 2023-08-13 MED ORDER — GADOPICLENOL 0.5 MMOL/ML IV SOLN
6.0000 mL | Freq: Once | INTRAVENOUS | Status: AC | PRN
  Administered 2023-08-13: 6 mL via INTRAVENOUS

## 2023-08-20 NOTE — Progress Notes (Signed)
 NEUROLOGY FOLLOW UP OFFICE NOTE  [name] [id number]  Subjective:  Evan Bryant is a 63 y.o. year old right-handed male with a medical history of HTN, SDH (10/2022), current smoker, EtOH abuse who we last saw on 03/01/23 for dizziness and right sided hearing loss.  To briefly review: 12/26/22: Patient was at the beach and endorses drinking EtOH on 11/05/22. He went on a wooden roller coaster and when he got off, he was stumbling all over the place. He was not like this prior to the roller coaster. He seemed more "drunk" at that point. Girlfriend is unsure how drunk he was and maybe this was why he was stumbling. He was helped to the hotel by his girlfriend. He got another drink and went to the balcony to smoke. As he was coming in, he tripped and hit his head on concrete floor. After he hit his head, he had extreme dizziness and not able to hear at all in the right ear. He describes the dizziness as when he stands or bends over he stumbles. If he is up for a while, symptoms improve some. There are no symptoms when he is sitting or driving or moving his head. He does not get dizzy when rolling over in bed. He feels like he has "water on the ear".   Patient presented to the ED on 11/14/22 with dizziness and hearing loss. CT head showed a nondisplaced fracture of the occipital skull base on right with small subdural hematoma along the floor and medial wall of the anterior cranial fossa. 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 Symptoms improved in ED with IVF and antivert . Neurosurgery indicated no further work up or surgical management was required. He was also started on antibiotics for acute otitis media, meclizine  for vertigo, and referred to ENT. He had hyponatremia and was started on salt tabs.   Patient then followed up with PCP on 12/05/22 and had no change in dizziness and meclizine  has not helped. He is still unable to hear in right ear. This continues to be true. He feels  symptoms are exactly the same since symptom onset after the fall. Patient has not seen ENT though he has been referred. He has not heard from anyone.   Patient currently has no headache. He denies diplopia, vision changes, difficulty swallowing, weakness or numbness or tingling.   EtOH use: typically a shot per day and maybe some beer; has had to stop over last week due to severe drunkenness if he drinks since fall Restriction diet: no  03/01/23: Blood work was significant for low B1 and borderline low B12. I recommended B1 100 mg daily and B12 1000 mcg daily. He is taking these.   He continues to have hearing loss and tinnitus in right ear. The dizziness has improved. He still has imbalance when getting up or changing position. This resolves after a few seconds. He has not had any further falls.   Patient did not schedule his MRI brain. He never got a call to schedule this.   Patient saw ENT on 02/04/23. He said he was told his deficits were likely permanent without much that could be done. Per note by Dr. 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  Normal tympanogram bilaterally.   Assessment: 1.  Chronic dizziness, likely secondary to concussive injury to his right inner ear. 2.  Sudden onset right ear severe to profound hearing loss, secondary to  his head injury. 3.  His right ear tinnitus is likely a direct result of his hearing loss.   Plan: 1.  The physical exam findings and the hearing test results are reviewed with the patient. 2.  The pathophysiology of dizziness and vestibular dysfunction are extensively discussed with the patient.  Questions were invited and answered. 3.  It is explained to the patient that his right ear hearing loss is likely permanent. 4.  The patient is a candidate for the CROS hearing aid. 5.  The strategies to cope with tinnitus, including the use of masker, hearing aids, tinnitus retraining therapy, and  avoidance of caffeine and alcohol are discussed. 6.  The patient will benefit from vestibular rehabilitation with a physical therapist.  A referral will be arranged soon as possible.  Most recent Assessment and Plan (03/01/23): This is Evan Bryant, a 63 y.o. male with: Hearing loss and imbalance - also following with ENT. Likely due to fall and injury to right inner ear B1 deficiency Borderline B12 deficiency EtOH abuse   Plan: -Will reorder MRI of brain w/wo contrast. Could consider cervical spine imaging if MRI brain is unrevealing. -Continue B12 1000 mcg daily -Continue B1 100 mg daily -Will be starting vestibular rehab -Discussed cutting back on EtOH would help prevent future worsening  Since their last visit: Overall, symptoms are exactly the same. He has not had any recent falls. He does have some dizziness after having sit or laying down for a while. Patient did not ever have vestibular therapy. He didn't get a call and didn't really want to do it.   He had his MRI brain on 08/13/23 (read pending).  He has run out of B1 and B12 and has not taken recently.  MEDICATIONS:  Outpatient Encounter Medications as of 08/30/2023  Medication Sig   amLODipine  (NORVASC ) 5 MG tablet Take 1 tablet (5 mg total) by mouth daily.   lisinopril  (ZESTRIL ) 10 MG tablet Take 1 tablet (10 mg total) by mouth daily.   amoxicillin -clavulanate (AUGMENTIN ) 875-125 MG tablet Take 1 tablet by mouth every 12 (twelve) hours. (Patient not taking: Reported on 08/30/2023)   cyanocobalamin (VITAMIN B12) 1000 MCG tablet Take 1,000 mcg by mouth daily. (Patient not taking: Reported on 08/30/2023)   meclizine  (ANTIVERT ) 25 MG tablet Take 1 tablet (25 mg total) by mouth 2 (two) times daily as needed for dizziness. (Patient not taking: Reported on 12/26/2022)   thiamine (VITAMIN B-1) 100 MG tablet Take 100 mg by mouth daily. (Patient not taking: Reported on 08/30/2023)   No facility-administered encounter medications on file as  of 08/30/2023.    PAST MEDICAL HISTORY: Past Medical History:  Diagnosis Date   Hypertension     PAST SURGICAL HISTORY: Past Surgical History:  Procedure Laterality Date   APPENDECTOMY     30s    ALLERGIES: No Known Allergies  FAMILY HISTORY: Family History  Problem Relation Age of Onset   Arthritis Mother    Hearing loss Mother    Heart disease Mother    Hyperlipidemia Mother    Hypertension Mother    Kidney disease Mother    Miscarriages / [place] Mother    Stroke Mother    Heart attack Father    Heart disease Father    Hypertension Father    Stroke Father    Asthma Sister    Early death Sister    Heart disease Sister    Hypertension Sister    Alcohol abuse Brother    Asthma  Brother    Depression Brother    Drug abuse Brother    Heart attack Brother    Hypertension Brother    Mental illness Brother    Stroke Brother    Early death Son    Cancer Sister        Breast Cancer   Heart attack Sister    Heart disease Sister    Hypertension Sister     SOCIAL HISTORY: Social History   Tobacco Use   Smoking status: Every Day    Current packs/day: 1.00    Types: Cigarettes, Cigars   Smokeless tobacco: Never   Tobacco comments:    Reports he's been smoking since age 28- 1 and 1/2 packs a day(03/01/23) 1 1/2 pack a day.08/29/23  Vaping Use   Vaping status: Every Day   Devices: just for fun  Substance Use Topics   Alcohol use: Yes    Alcohol/week: 15.0 standard drinks of alcohol    Types: 15 Shots of liquor per week    Comment: cocktail in the evening 2 0z.   Drug use: Never   Social History   Social History Narrative   Are you right handed or left handed? Right   What is your current occupation?   Do you live at home alone? [employer]   Who lives with you? With girlfriend   What type of home do you live in: 1 story or 2 story? one    Caffeine a lot 10 cups a day      Objective:  Vital Signs:  BP (!) 154/89 Comment: refused recheck..   Pulse 68   Ht 6' (1.829 m)   Wt 146 lb (66.2 kg)   SpO2 99%   BMI 19.80 kg/m   General: No acute distress.  Patient appears well-groomed.   Neck: supple Heart: regular rate and rhythm Lungs: Non-labored breathing on room air   Neurological Exam: Mental status: alert and oriented, speech fluent and not dysarthric, language intact.  Cranial nerves: CN I: not tested CN II: pupils equal, round and reactive to light, visual fields intact CN III, IV, VI:  full range of motion, end gaze nystagmus, no ptosis. CN V: facial sensation intact. CN VII: upper and lower face symmetric CN VIII: hearing intact CN IX, X: uvula midline CN XI: sternocleidomastoid and trapezius muscles intact CN XII: tongue midline  Bulk & Tone: normal, no fasciculations. Motor:  muscle strength 5/5 throughout Deep Tendon Reflexes:  2+ throughout.   Sensation:  Light touch sensation intact. Finger to nose testing:  Without dysmetria.   Gait:  Normal station and stride.  Romberg negative.   Labs and Imaging review: New results: MRI brain w/wo contrast (08/13/23): Read pending  Previously reviewed results: 12/26/22: Vit E wnl Folate wnl Copper  wnl B12: 335 B1: < 6   12/05/22: BMP: Na 134 CBC: Hb 17.7   11/14/22: Mg: 2.2 CMP: Na 129, glucose 126, AST 51, ALT wnl CBC: Hb 18.8   Imaging: CT head wo contrast (11/14/22): FINDINGS: Brain: Normal appearance of the brainstem. 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 Cerebral hemispheres do not show evidence of an old small or large vessel stroke. There is a small subdural hematoma along the floor and medial wall of the anterior cranial fossa on the left. No mass effect or shift. No sign of intraparenchymal hemorrhage. No hydrocephalus.   Vascular: No abnormal vascular finding.   Skull: Linear nondisplaced fracture of the occipital skull base on the right.  Occipital calvarium on the right. No other skull fracture. No evidence that the  fracture actually involves the right temporal bone. One could question a subtle transverse fracture in the region of the otic capsule but I do not think this is definite. Consider high-resolution temporal bone CT.   Sinuses/Orbits: Clear/normal   Other: No fluid in the middle ears or mastoid air cells.   IMPRESSION: 1. Linear nondisplaced fracture of the occipital skull base on the right. No evidence that the fracture actually involves the right temporal bone. Fracture extends back into the right occipital calvarium. As there is a description of hearing loss on the right following the fall, consider a temporal bone CT for high-resolution evaluation of the right temporal bone. 2. Small subdural hematoma along the floor and medial wall of the anterior cranial fossa on the left. No mass effect or shift. 3. 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   CT temporal bones wo contrast (11/14/22): FINDINGS: RIGHT TEMPORAL BONE   External auditory canal: Normal.  Tympanic membrane is normal.   Middle ear cavity: No fluid in the middle ear. Ossicular chain is normal. The attic is clear.   Inner ear structures: Inner ear structures are normal. No evidence of any temporal bone fracture/otic capsule injury.   Internal auditory and facial nerve canals: Normal. Unaffected by the skull base fracture.   Mastoid air cells: Clear.   LEFT TEMPORAL BONE   External auditory canal: Normal.  Tympanic membrane is normal.   Middle ear cavity: Ossicular chain is normal. No fluid in the middle ear or attic.   Inner ear structures: Normal   Internal auditory and facial nerve canals:  Normal   Mastoid air cells: Clear except for a few opacified air cells at the mastoid tip, not significant.   Vascular: Normal   Limited intracranial: As previously shown, there is a small amount of subdural blood along the inferior surface of the anterior cranial fossa on the left.   Visible  orbits/paranasal sinuses: Normal   Soft tissues: Otherwise normal   IMPRESSION: 1. No evidence of temporal bone fracture/otic capsule injury. No fluid in the middle ear or mastoid air cells. 2. Small amount of subdural blood along the inferior surface of the anterior cranial fossa on the left, as previously shown by head CT. 3. Skull base and occipital bone fracture on the right as previously shown.  Hearing test (02/04/23): The hearing test results indicate: Right ear: Severe mixed hearing loss from [phone] sloping to profound mixed hearing loss from 2000-3000 Hz with no responses recorded from 4000-8000 Hz. Left ear: Normal hearing sensitivity from [phone] sloping to moderate sensorineural hearing loss from 6000-8000 Hz. There is a significant difference between the ears for pure tone thresholds.  Assessment/Plan:  This is Evan Bryant, a 63 y.o. male with: Hearing loss and imbalance - has seen ENT. Likely due to fall, skull fracture, and injury to right inner ear B1 deficiency - has run out and not been taking lately Borderline B12 deficiency - has run out and not been taking lately EtOH abuse   Plan: -Follow on MRI brain - patient would like MyChart message -Restart B1 100 mg daily -Restart B12 1000 mcg daily -Again discussed importance of EtOH cessation  Return to clinic as needed  Rommie Coats, MD

## 2023-08-30 ENCOUNTER — Encounter: Payer: Self-pay | Admitting: Neurology

## 2023-08-30 ENCOUNTER — Ambulatory Visit: Payer: Medicaid Other | Admitting: Neurology

## 2023-08-30 VITALS — BP 154/89 | HR 68 | Ht 72.0 in | Wt 146.0 lb

## 2023-08-30 DIAGNOSIS — S069X9A Unspecified intracranial injury with loss of consciousness of unspecified duration, initial encounter: Secondary | ICD-10-CM | POA: Diagnosis not present

## 2023-08-30 DIAGNOSIS — H9191 Unspecified hearing loss, right ear: Secondary | ICD-10-CM | POA: Diagnosis not present

## 2023-08-30 DIAGNOSIS — F1010 Alcohol abuse, uncomplicated: Secondary | ICD-10-CM | POA: Diagnosis not present

## 2023-08-30 DIAGNOSIS — R42 Dizziness and giddiness: Secondary | ICD-10-CM

## 2023-08-30 DIAGNOSIS — S065XAA Traumatic subdural hemorrhage with loss of consciousness status unknown, initial encounter: Secondary | ICD-10-CM

## 2023-08-30 NOTE — Patient Instructions (Addendum)
 I will be in touch when I have a final read from radiology on your MRI brain.  You should be taking the following supplements: B1 100 mg daily B12 1000 mcg daily  Please let me know if you have any questions or concerns.  The physicians and staff at [facility] are committed to providing excellent care. You may receive a survey requesting feedback about your experience at our office. We strive to receive "very good" responses to the survey questions. If you feel that your experience would prevent you from giving the office a "very good " response, please contact our office to try to remedy the situation. We may be reached at [phone]. Thank you for taking the time out of your busy day to complete the survey.  Rommie Coats, MD [facility]  Preventing Falls at [facility] are common, often dreaded events in the lives of older people. Aside from the obvious injuries and even death that may result, fall can cause wide-ranging consequences including loss of independence, mental decline, decreased activity and mobility. Younger people are also at risk of falling, especially those with chronic illnesses and fatigue.  Ways to reduce risk for falling Examine diet and medications. Warm foods and alcohol dilate blood vessels, which can lead to dizziness when standing. Sleep aids, antidepressants and pain medications can also increase the likelihood of a fall.  Get a vision exam. Poor vision, cataracts and glaucoma increase the chances of falling.  Check foot gear. Shoes should fit snugly and have a sturdy, nonskid sole and a broad, low heel  Participate in a physician-approved exercise program to build and maintain muscle strength and improve balance and coordination. Programs that use ankle weights or stretch bands are excellent for muscle-strengthening. Water aerobics programs and low-impact Tai Chi programs have also been shown to improve balance and coordination.  Increase vitamin D  intake. Vitamin D improves muscle strength and increases the amount of calcium the body is able to absorb and deposit in bones.  How to prevent falls from common hazards Floors - Remove all loose wires, cords, and throw rugs. Minimize clutter. Make sure rugs are anchored and smooth. Keep furniture in its usual place.  Chairs -- Use chairs with straight backs, armrests and firm seats. Add firm cushions to existing pieces to add height.  Bathroom - Install grab bars and non-skid tape in the tub or shower. Use a bathtub transfer bench or a shower chair with a back support Use an elevated toilet seat and/or safety rails to assist standing from a low surface. Do not use towel racks or bathroom tissue holders to help you stand.  Lighting - Make sure halls, stairways, and entrances are well-lit. Install a night light in your bathroom or hallway. Make sure there is a light switch at the top and bottom of the staircase. Turn lights on if you get up in the middle of the night. Make sure lamps or light switches are within reach of the bed if you have to get up during the night.  Kitchen - Install non-skid rubber mats near the sink and stove. Clean spills immediately. Store frequently used utensils, pots, pans between waist and eye level. This helps prevent reaching and bending. Sit when getting things out of lower cupboards.  Living room/ Bedrooms - Place furniture with wide spaces in between, giving enough room to move around. Establish a route through the living room that gives you something to hold onto as you walk.  Stairs -  Make sure treads, rails, and rugs are secure. Install a rail on both sides of the stairs. If stairs are a threat, it might be helpful to arrange most of your activities on the lower level to reduce the number of times you must climb the stairs.  Entrances and doorways - Install metal handles on the walls adjacent to the doorknobs of all doors to make it more secure as you travel  through the doorway.  Tips for maintaining balance Keep at least one hand free at all times. Try using a backpack or fanny pack to hold things rather than carrying them in your hands. Never carry objects in both hands when walking as this interferes with keeping your balance.  Attempt to swing both arms from front to back while walking. This might require a conscious effort if Parkinson's disease has diminished your movement. It will, however, help you to maintain balance and posture, and reduce fatigue.  Consciously lift your feet off of the ground when walking. Shuffling and dragging of the feet is a common culprit in losing your balance.  When trying to navigate turns, use a "U" technique of facing forward and making a wide turn, rather than pivoting sharply.  Try to stand with your feet shoulder-length apart. When your feet are close together for any length of time, you increase your risk of losing your balance and falling.  Do one thing at a time. Don't try to walk and accomplish another task, such as reading or looking around. The decrease in your automatic reflexes complicates motor function, so the less distraction, the better.  Do not wear rubber or gripping soled shoes, they might "catch" on the floor and cause tripping.  Move slowly when changing positions. Use deliberate, concentrated movements and, if needed, use a grab bar or walking aid. Count 15 seconds between each movement. For example, when rising from a seated position, wait 15 seconds after standing to begin walking.  If balance is a continuous problem, you might want to consider a walking aid such as a cane, walking stick, or walker. Once you've mastered walking with help, you might be ready to try it on your own again.

## 2023-09-02 ENCOUNTER — Telehealth: Payer: Self-pay | Admitting: Neurology

## 2023-09-02 DIAGNOSIS — Z419 Encounter for procedure for purposes other than remedying health state, unspecified: Secondary | ICD-10-CM | POA: Diagnosis not present

## 2023-09-02 NOTE — Telephone Encounter (Signed)
 Attempted to call patient about results of MRI brain. I left a message asking for a call back. I will also attempt to send a MyChart message.  Rommie Coats, MD [facility]

## 2023-10-03 DIAGNOSIS — Z419 Encounter for procedure for purposes other than remedying health state, unspecified: Secondary | ICD-10-CM | POA: Diagnosis not present

## 2023-10-13 IMAGING — US US ABDOMEN LIMITED
2 series · 14 of 25 positions shown · non-contrast
Comparison: None.

CLINICAL DATA: Elevated liver function tests.

EXAM:
ULTRASOUND ABDOMEN LIMITED RIGHT UPPER QUADRANT

[Series 1: us abdomen limited · 0.25mm/px · 13 of 46 slices shown (1 of 2)]
[im 1/46]
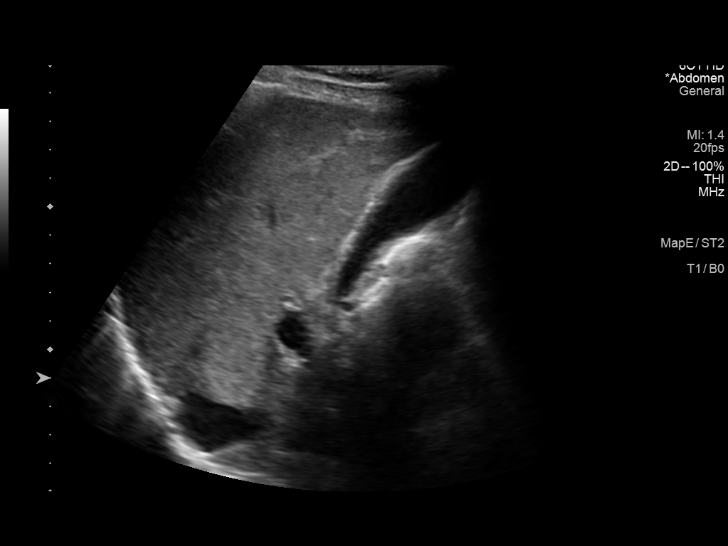
[im 4/46]
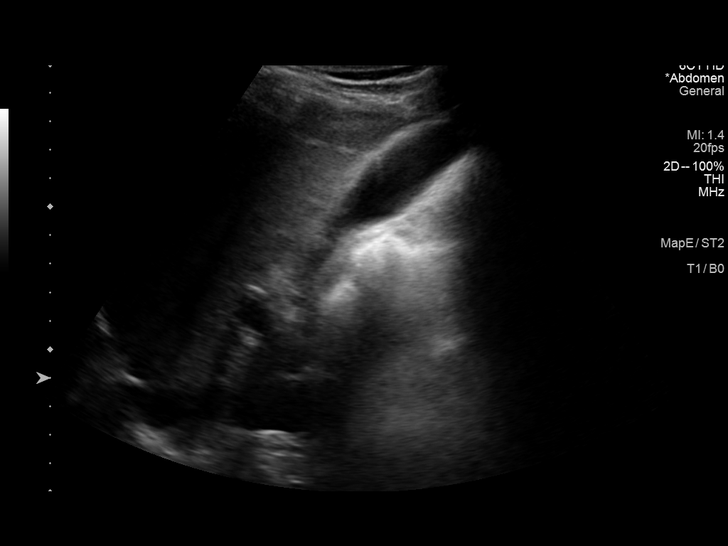
[im 8/46]
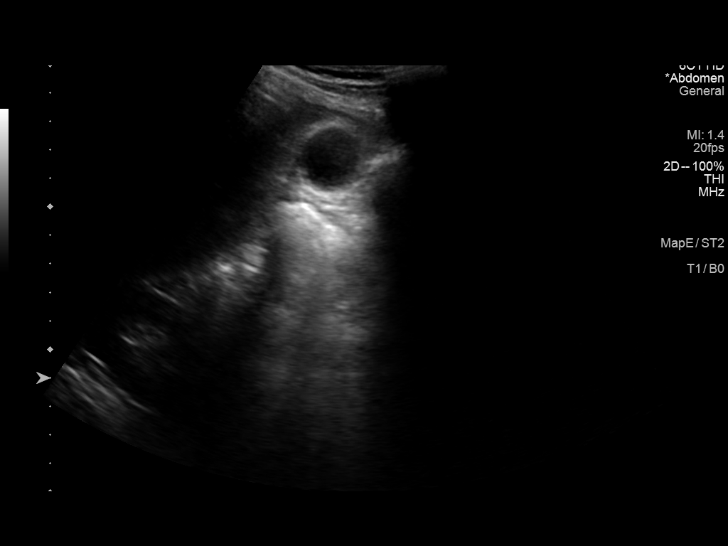
[im 12/46]
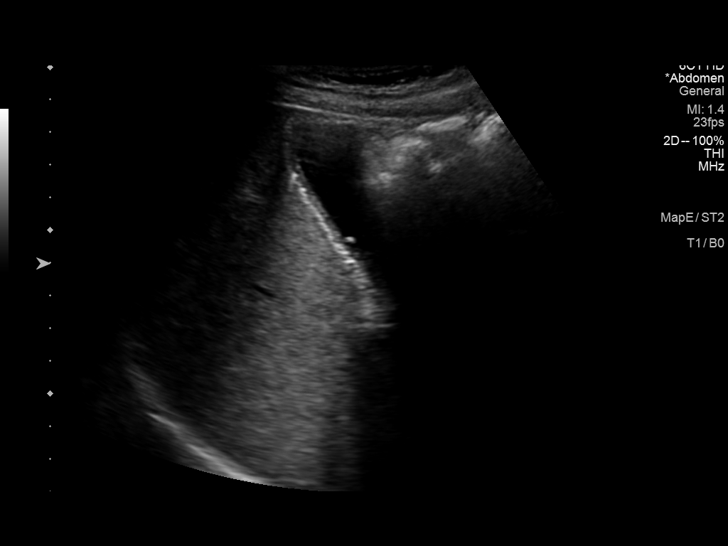
[im 16/46]
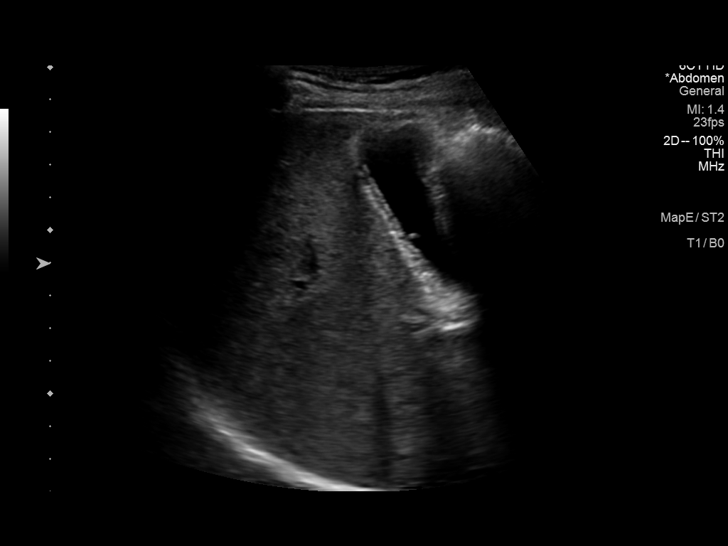
[im 18/46]
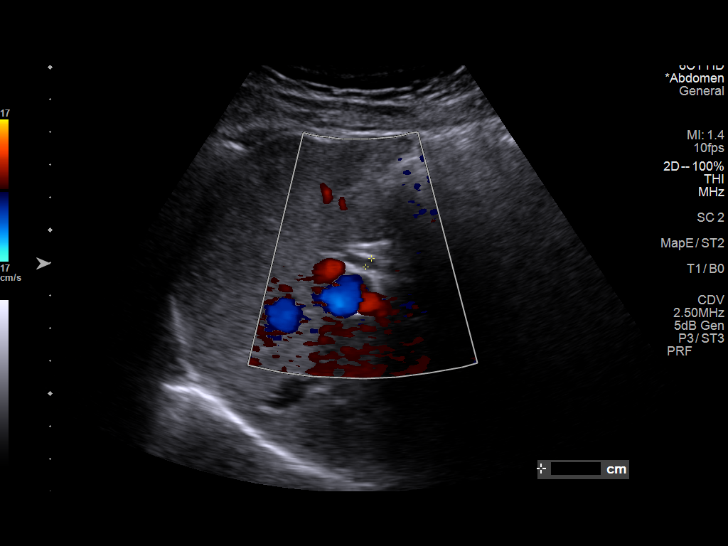
[im 22/46]
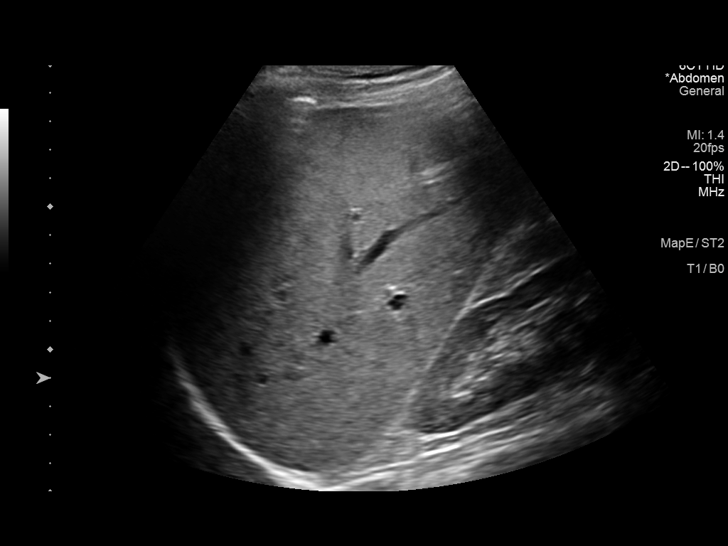
[im 26/46]
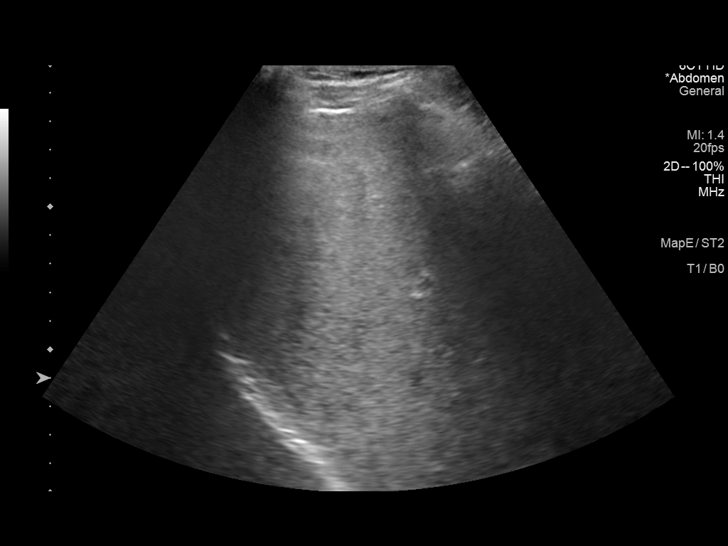
[im 30/46]
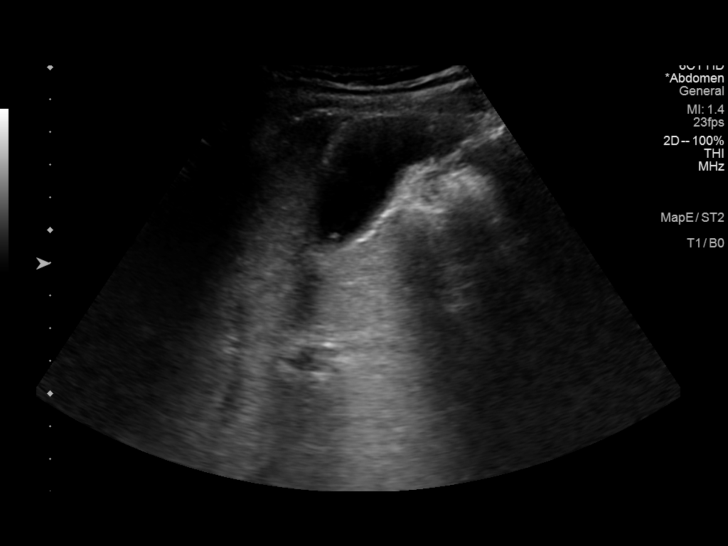
[im 32/46]
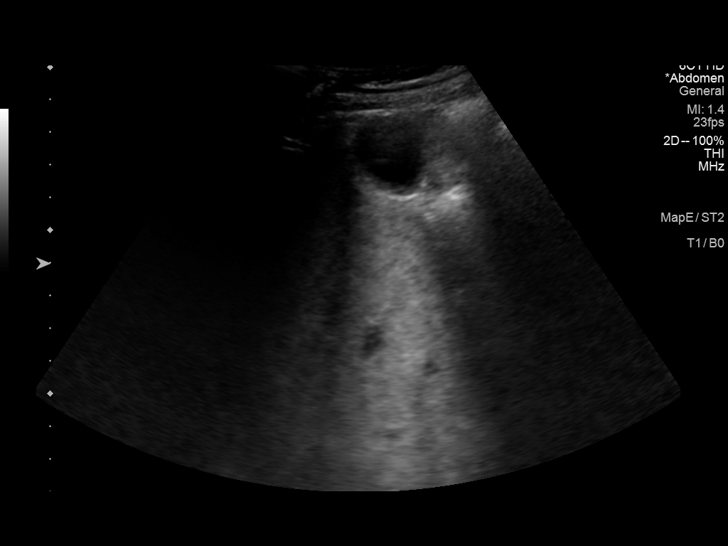
[im 36/46]
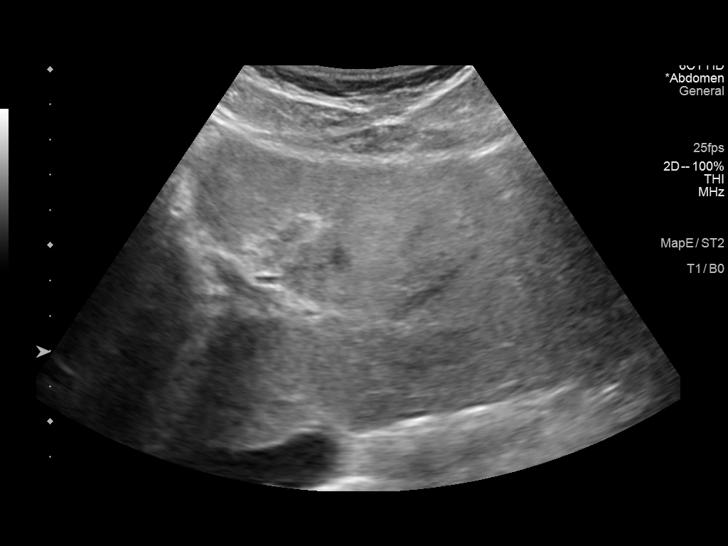
[im 40/46]
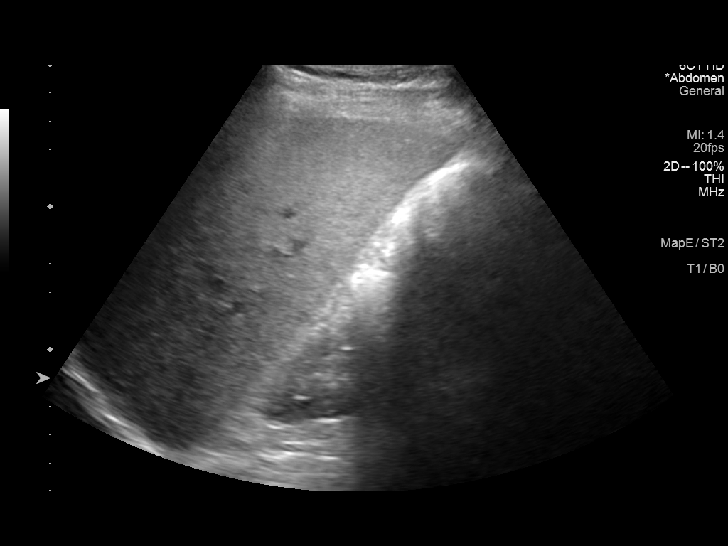
[im 44/46]
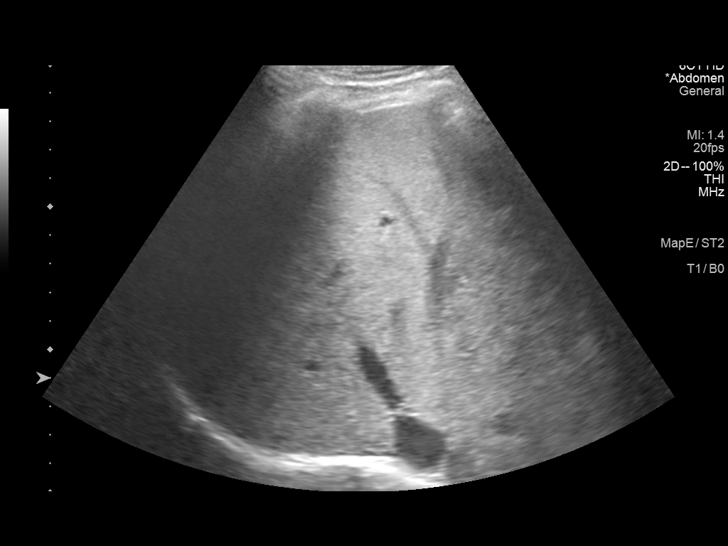

[Series 2001: us abdomen limited · 0.25mm/px · 1 of 2 slices shown (2 of 2)]
[im 1/2]
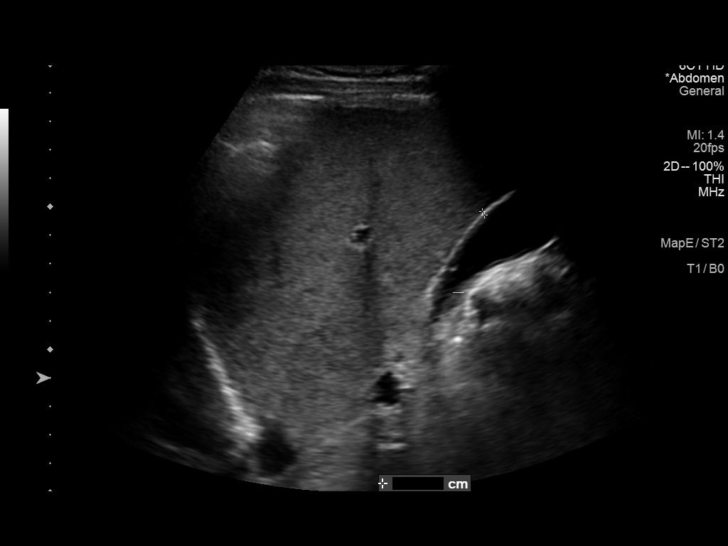

[14 of 25 positions shown; findings below may reference images not displayed]

FINDINGS: Gallbladder:

No gallstones or wall thickening visualized. No sonographic Murphy
sign noted by sonographer. Probable 3 mm polyp is noted.

Common bile duct:

Diameter: 3 mm which is within normal limits.

Liver:

No focal lesion identified. Mildly increased echogenicity of hepatic
parenchyma is noted suggesting hepatic steatosis. Portal vein is
patent on color Doppler imaging with normal direction of blood flow
towards the liver.

Other: None.
IMPRESSION: Probable 3 mm gallbladder polyp.  Probable hepatic steatosis.

## 2023-11-02 DIAGNOSIS — Z419 Encounter for procedure for purposes other than remedying health state, unspecified: Secondary | ICD-10-CM | POA: Diagnosis not present

## 2023-11-06 ENCOUNTER — Other Ambulatory Visit: Payer: Self-pay | Admitting: Family Medicine

## 2023-11-06 DIAGNOSIS — I10 Essential (primary) hypertension: Secondary | ICD-10-CM

## 2023-11-06 NOTE — Telephone Encounter (Signed)
 Called patient and left vm for patient to schedule appt for more refills.

## 2023-11-07 NOTE — Telephone Encounter (Signed)
 Tried calling patient to schedule appt prior to refilling medications. Unable to leave vm.

## 2023-11-08 NOTE — Telephone Encounter (Signed)
 FYI   Copied from CRM [id number]. Topic: Clinical - Medication Question >> Nov 08, 2023  3:44 PM Deleta RAMAN wrote: Reason for CRM: Patient called in about missed call from the office. I notified patients on the message/ communication on his chart. Patient was notified 30 day supply will be covered and an appointment is needed for next refill request past 30 days. Patient mentioned he will call back on Monday to schedule for the refill request.

## 2023-11-08 NOTE — Telephone Encounter (Signed)
 Sent in 30 days of medications. LVM for patient letting him know he would need a follow up for further refills. Asked that he call back to schedule. UTC letter sent as this is the third attempt

## 2023-12-03 ENCOUNTER — Other Ambulatory Visit: Payer: Self-pay | Admitting: Family Medicine

## 2023-12-03 DIAGNOSIS — I10 Essential (primary) hypertension: Secondary | ICD-10-CM

## 2023-12-03 DIAGNOSIS — Z419 Encounter for procedure for purposes other than remedying health state, unspecified: Secondary | ICD-10-CM | POA: Diagnosis not present

## 2023-12-13 ENCOUNTER — Encounter: Payer: Self-pay | Admitting: Family Medicine

## 2023-12-13 ENCOUNTER — Ambulatory Visit (INDEPENDENT_AMBULATORY_CARE_PROVIDER_SITE_OTHER): Admitting: Family Medicine

## 2023-12-13 VITALS — BP 126/88 | HR 69 | Temp 98.1°F | Resp 21 | Ht 72.0 in | Wt 142.8 lb

## 2023-12-13 DIAGNOSIS — Z720 Tobacco use: Secondary | ICD-10-CM | POA: Diagnosis not present

## 2023-12-13 DIAGNOSIS — R42 Dizziness and giddiness: Secondary | ICD-10-CM | POA: Diagnosis not present

## 2023-12-13 DIAGNOSIS — H9191 Unspecified hearing loss, right ear: Secondary | ICD-10-CM

## 2023-12-13 DIAGNOSIS — F1090 Alcohol use, unspecified, uncomplicated: Secondary | ICD-10-CM

## 2023-12-13 DIAGNOSIS — I10 Essential (primary) hypertension: Secondary | ICD-10-CM

## 2023-12-13 DIAGNOSIS — E871 Hypo-osmolality and hyponatremia: Secondary | ICD-10-CM | POA: Diagnosis not present

## 2023-12-13 DIAGNOSIS — Z13220 Encounter for screening for lipoid disorders: Secondary | ICD-10-CM | POA: Diagnosis not present

## 2023-12-13 DIAGNOSIS — E519 Thiamine deficiency, unspecified: Secondary | ICD-10-CM | POA: Diagnosis not present

## 2023-12-13 DIAGNOSIS — E538 Deficiency of other specified B group vitamins: Secondary | ICD-10-CM

## 2023-12-13 LAB — COMPREHENSIVE METABOLIC PANEL WITH GFR
ALT: 14 U/L (ref 0–53)
AST: 16 U/L (ref 0–37)
Albumin: 4.6 g/dL (ref 3.5–5.2)
Alkaline Phosphatase: 73 U/L (ref 39–117)
BUN: 12 mg/dL (ref 6–23)
CO2: 28 meq/L (ref 19–32)
Calcium: 9.7 mg/dL (ref 8.4–10.5)
Chloride: 103 meq/L (ref 96–112)
Creatinine, Ser: 0.97 mg/dL (ref 0.40–1.50)
GFR: 83.24 mL/min (ref >60.00)
Glucose, Bld: 85 mg/dL (ref 70–99)
Potassium: 4.2 meq/L (ref 3.5–5.1)
Sodium: 141 meq/L (ref 135–145)
Total Bilirubin: 0.5 mg/dL (ref 0.2–1.2)
Total Protein: 7.8 g/dL (ref 6.0–8.3)

## 2023-12-13 LAB — LIPID PANEL
Cholesterol: 205 mg/dL — ABNORMAL HIGH (ref 0–200)
HDL: 73.6 mg/dL (ref >39.00)
LDL Cholesterol: 109 mg/dL — ABNORMAL HIGH (ref 0–99)
NonHDL: 131.26
Total CHOL/HDL Ratio: 3
Triglycerides: 113 mg/dL (ref 0.0–149.0)
VLDL: 22.6 mg/dL (ref 0.0–40.0)

## 2023-12-13 LAB — VITAMIN B12: Vitamin B-12: 623 pg/mL (ref 211–911)

## 2023-12-13 MED ORDER — AMLODIPINE BESYLATE 5 MG PO TABS
5.0000 mg | ORAL_TABLET | Freq: Every day | ORAL | 2 refills | Status: AC
Start: 2023-12-13 — End: ?

## 2023-12-13 MED ORDER — LISINOPRIL 10 MG PO TABS
10.0000 mg | ORAL_TABLET | Freq: Every day | ORAL | 2 refills | Status: AC
Start: 2023-12-13 — End: ?

## 2023-12-13 NOTE — Patient Instructions (Signed)
 Thanks for coming in today.  I will check some lab work.  Continue the vitamin B supplements.  Depending on results we can discuss if additional supplements are needed.  Repeat blood pressure looked okay, no med changes at this time.  I do recommend cutting back further on alcohol as we discussed.  Let me know if you have any difficulty cutting back on alcohol.  If you are ready to quit smoking, let me know and I am happy to help. I am sorry to hear about the persistent dizziness.  Keep follow-up with your specialist as planned, and you certainly could look into Social Security disability to see if you would qualify. Follow-up with me in 6 months, but let me know if there are any questions in the meantime and take care.

## 2023-12-13 NOTE — Progress Notes (Signed)
 Subjective:  Patient ID: Evan Bryant, male    DOB: 07-15-1960  Age: 63 y.o. MRN: [id number]  CC:  Chief Complaint  Patient presents with   Follow-up    Needs medication refills    HPI Evan Bryant presents for   Follow-up of chronic conditions Last visit in August 2024.  Balance difficulty, history of cerebellar infarction, subdural hematoma: Discussed at his August 2024 visit.  Persistent dizziness and balance issues at that time.  Persistent, consistent use of antihypertensives discussed at that time.  Follow-up with neuro planned, follow-up with audiology, ENT if needed. Most recent visit with neuro on May 9th.  B1 deficiency, borderline B12 deficiency, had not been taking meds.  Restarted B1 100 mg daily, B12 1000 mg daily.  Importance of alcohol cessation discussed with history of alcohol abuse.  Follow-up MRI brain that time as well.  Dizziness was thought to be due to a fall, skull fracture and injury to right inner ear, followed by ENT.  Has been taking B12. Another B vitamin as well - not sure if on B1. Alcohol: 2 days per week -1 pint of Vodka.  Last drank 2 days ago.  Denies addiction to alcohol - able to not drink for days. Denies hx of W/D symptoms. Denies need for assistance. Advised to decrease alcohol intake further. Agrees to cut back.   Has applied for work - had to leave prior job d/t dizziness. Discussed looking into disability through social security.   Hypertension: Alcohol use/abuse as above.  Some inconsistency with use of antihypertensives previously.  Treated with lisinopril  10 mg daily, amlodipine  5 mg daily.  Borderline hyponatremia of 134 on his BMP last August, lower at 129 previously. Taking BP meds - last dose yesterday morning - misses one day per week. Doing better with pill container.  No new dizziness, CP or dyspnea. Same dizziness. Still loss of hearing in R ear.  Home readings - 145/90 range.   BP Readings from Last 3 Encounters:  12/13/23  126/88  08/30/23 (!) 154/89  03/01/23 (!) 150/94   Lab Results  Component Value Date   CREATININE 1.02 12/05/2022   Lab Results  Component Value Date   CHOL 207 (H) 03/24/2021   HDL 59.10 03/24/2021   LDLCALC 110 (H) 03/24/2021   TRIG 190.0 (H) 03/24/2021   CHOLHDL 4 03/24/2021     Nicotine addiction: 1/5 pack per day. Has cut back a little. Declines quitting or further decreases at this time.    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 Authorizing Provider  amLODipine  (NORVASC ) 5 MG tablet TAKE 1 TABLET (5 MG TOTAL) BY MOUTH DAILY. 11/08/23  Yes Levora Reyes SAUNDERS, MD  lisinopril  (ZESTRIL ) 10 MG tablet TAKE 1 TABLET BY MOUTH EVERY DAY  11/08/23  Yes Levora Reyes SAUNDERS, MD  amoxicillin -clavulanate (AUGMENTIN ) 875-125 MG tablet Take 1 tablet by mouth every 12 (twelve) hours. Patient not taking: Reported on 12/13/2023 11/14/22   Elnor Savant A, DO  cyanocobalamin (VITAMIN B12) 1000 MCG tablet Take 1,000 mcg by mouth daily. Patient not taking: Reported on 12/13/2023    [provider]  thiamine (VITAMIN B-1) 100 MG tablet Take 100 mg by mouth daily. Patient not taking: Reported on 12/13/2023    [provider]   Social History   Socioeconomic History   Marital  status: Married    Spouse name: Not on file   Number of children: Not on file   Years of education: Not on file   Highest education level: Not on file  Occupational History   Not on file  Tobacco Use   Smoking status: Every Day    Current packs/day: 1.00    Types: Cigarettes, Cigars   Smokeless tobacco: Never   Tobacco comments:    Reports he's been smoking since age 60- 1 and 1/2 packs a day(03/01/23) 1 1/2 pack a day.08/29/23  Vaping Use   Vaping status: Every Day   Devices: just for fun  Substance and Sexual Activity   Alcohol use: Yes    Alcohol/week: 15.0 standard drinks of alcohol    Types: 15 Shots of liquor per week    Comment: cocktail in the evening 2 0z.   Drug use: Never   Sexual activity: Yes    Birth control/protection: None    Comment: declined condoms  Other Topics Concern   Not on file  Social History Narrative   Are you right handed or left handed? Right   What is your current occupation?   Do you live at home alone? [employer] at [place]   Who lives with you? With girlfriend   What type of home do you live in: 1 story or 2 story? one    Caffeine a lot 10 cups a day   Social Drivers of Corporate investment banker Strain: Not on file  Food Insecurity: Not on file  Transportation Needs: Not on file  Physical Activity: Not on file  Stress: Not on file  Social Connections: Not on file  Intimate Partner Violence: Not on file    Review of Systems  Constitutional:  Negative for fatigue and unexpected weight change.  Eyes:  Negative for visual disturbance.  Respiratory:  Negative for cough, chest tightness and shortness of breath.   Cardiovascular:  Negative for chest pain, palpitations and leg swelling.  Gastrointestinal:  Negative for abdominal pain and blood in stool.  Neurological:  Positive for dizziness. Negative for light-headedness and headaches.     Objective:   Vitals:   12/13/23 1002 12/13/23 1036  BP: (!) 140/76 126/88  Pulse: 69   Resp:  (!) 21   Temp: 98.1 F (36.7 C)   TempSrc: Temporal   SpO2: 98%   Weight: 142 lb 12.8 oz (64.8 kg)   Height: 6' (1.829 m)     Physical Exam Vitals reviewed.  Constitutional:      Appearance: He is well-developed.  HENT:     Head: Normocephalic and atraumatic.  Neck:     Vascular: No carotid bruit or JVD.  Cardiovascular:     Rate and Rhythm: Normal rate and regular rhythm.     Heart sounds: Normal heart sounds. No murmur heard. Pulmonary:     Effort: Pulmonary effort is normal.     Breath  sounds: Normal breath sounds. No rales.  Musculoskeletal:     Right lower leg: No edema.     Left lower leg: No edema.  Skin:    General: Skin is warm and dry.  Neurological:     Mental Status: He is alert and oriented to person, place, and time.  Psychiatric:        Mood and Affect: Mood normal.        Assessment & Plan:  AZAVIER CRESON is a 63 y.o. male . Essential hypertension - Plan: Comprehensive metabolic panel with GFR  - Stable on current regimen, continue same.  Continued cutting back of alcohol recommended.  Check labs and adjust plan accordingly  Hearing loss of right ear, unspecified hearing loss type Dizziness  - Persistent dizziness, followed by ENT, Neuro as above.  Unfortunately has had some difficulty with obtaining work given some of these symptoms.  We did briefly discuss checking into Social Security disability to see if he would qualify.  Tobacco abuse disorder  - Potential health impacts discussed, not ready to quit at this time.  Alcohol use Thiamine deficiency - Plan: Vitamin B1 B12 deficiency - Plan: B12  - Check B12, thiamine level, supplementation recommended.  He is currently on B vitamins, unsure which vitamins.  Change plan based on labs above.  Commended decrease alcohol intake.  Hyponatremia - Plan: Comprehensive metabolic panel with GFR  - Prior mild hyponatremia, check labs and adjust plan accordingly.  Screening for hyperlipidemia - Plan:  Lipid panel  - No current meds, check labs and adjust plan/recommendations accordingly.  No orders of the defined types were placed in this encounter.  Patient Instructions  Thanks for coming in today.  I will check some lab work.  Continue the vitamin B supplements.  Depending on results we can discuss if additional supplements are needed.  Repeat blood pressure looked okay, no med changes at this time.  I do recommend cutting back further on alcohol as we discussed.  Let me know if you have any difficulty cutting back on alcohol.  If you are ready to quit smoking, let me know and I am happy to help. I am sorry to hear about the persistent dizziness.  Keep follow-up with your specialist as planned, and you certainly could look into Social Security disability to see if you would qualify. Follow-up with me in 6 months, but let me know if there are any questions in the meantime and take care.     Signed,   Reyes Pines, MD [facility], [facility] 12/13/23 10:41 AM

## 2023-12-17 LAB — VITAMIN B1: Vitamin B1 (Thiamine): <6 nmol/L — ABNORMAL LOW (ref 8–30)

## 2023-12-18 ENCOUNTER — Ambulatory Visit: Payer: Self-pay | Admitting: Family Medicine

## 2023-12-18 MED ORDER — VITAMIN B-1 100 MG PO TABS: 100.0000 mg | ORAL_TABLET | Freq: Every day | ORAL | 5 refills | Status: AC

## 2024-01-03 DIAGNOSIS — Z419 Encounter for procedure for purposes other than remedying health state, unspecified: Secondary | ICD-10-CM | POA: Diagnosis not present

## 2024-06-18 ENCOUNTER — Ambulatory Visit: Admitting: Family Medicine
